# Patient Record
Sex: Male | Born: 1995 | Hispanic: Refuse to answer | Marital: Single | Smoking: Current every day smoker
Health system: Southern US, Community
[De-identification: ages and names within clinical notes are randomized; demographics above are authoritative.]

---
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
## 2019-07-25 ENCOUNTER — Encounter (HOSPITAL_COMMUNITY): Payer: Self-pay | Admitting: Psychiatry

## 2019-07-25 ENCOUNTER — Inpatient Hospital Stay (HOSPITAL_COMMUNITY)
Admission: AD | Admit: 2019-07-25 | Discharge: 2019-07-29 | DRG: 881 | Disposition: A | Discharge status: Home / Self Care | Payer: BC Managed Care – PPO | Attending: Psychiatry | Admitting: Psychiatry

## 2019-07-25 ENCOUNTER — Other Ambulatory Visit: Payer: Self-pay

## 2019-07-25 DIAGNOSIS — F419 Anxiety disorder, unspecified: Secondary | ICD-10-CM | POA: Diagnosis present

## 2019-07-25 DIAGNOSIS — F22 Delusional disorders: Secondary | ICD-10-CM | POA: Diagnosis present

## 2019-07-25 DIAGNOSIS — F17210 Nicotine dependence, cigarettes, uncomplicated: Secondary | ICD-10-CM | POA: Diagnosis present

## 2019-07-25 DIAGNOSIS — Z20822 Contact with and (suspected) exposure to covid-19: Secondary | ICD-10-CM | POA: Diagnosis present

## 2019-07-25 DIAGNOSIS — F333 Major depressive disorder, recurrent, severe with psychotic symptoms: Secondary | ICD-10-CM

## 2019-07-25 DIAGNOSIS — R45851 Suicidal ideations: Secondary | ICD-10-CM | POA: Diagnosis present

## 2019-07-25 DIAGNOSIS — F329 Major depressive disorder, single episode, unspecified: Principal | ICD-10-CM | POA: Diagnosis present

## 2019-07-25 DIAGNOSIS — F29 Unspecified psychosis not due to a substance or known physiological condition: Secondary | ICD-10-CM | POA: Diagnosis present

## 2019-07-25 DIAGNOSIS — F1290 Cannabis use, unspecified, uncomplicated: Secondary | ICD-10-CM | POA: Diagnosis present

## 2019-07-25 LAB — CBC
HCT: 46 % (ref 39.0–52.0)
Hemoglobin: 15.5 g/dL (ref 13.0–17.0)
MCH: 30.5 pg (ref 26.0–34.0)
MCHC: 33.7 g/dL (ref 30.0–36.0)
MCV: 90.6 fL (ref 80.0–100.0)
Platelets: 202 10*3/uL (ref 150–400)
RBC: 5.08 MIL/uL (ref 4.22–5.81)
RDW: 12.3 % (ref 11.5–15.5)
WBC: 7 10*3/uL (ref 4.0–10.5)
nRBC: 0 % (ref 0.0–0.2)

## 2019-07-25 LAB — COMPREHENSIVE METABOLIC PANEL
ALT: 14 U/L (ref 0–44)
AST: 15 U/L (ref 15–41)
Albumin: 4.4 g/dL (ref 3.5–5.0)
Alkaline Phosphatase: 66 U/L (ref 38–126)
Anion gap: 12 (ref 5–15)
BUN: 10 mg/dL (ref 6–20)
CO2: 26 mmol/L (ref 22–32)
Calcium: 9.3 mg/dL (ref 8.9–10.3)
Chloride: 105 mmol/L (ref 98–111)
Creatinine, Ser: 0.97 mg/dL (ref 0.61–1.24)
GFR calc Af Amer: >60 mL/min (ref >60)
GFR calc non Af Amer: >60 mL/min (ref >60)
Glucose, Bld: 92 mg/dL (ref 70–99)
Potassium: 3.8 mmol/L (ref 3.5–5.1)
Sodium: 143 mmol/L (ref 135–145)
Total Bilirubin: 0.6 mg/dL (ref 0.3–1.2)
Total Protein: 7.3 g/dL (ref 6.5–8.1)

## 2019-07-25 LAB — LIPID PANEL
Cholesterol: 183 mg/dL (ref 0–200)
HDL: 43 mg/dL (ref >40)
LDL Cholesterol: 96 mg/dL (ref 0–99)
Total CHOL/HDL Ratio: 4.3 RATIO
Triglycerides: 218 mg/dL — ABNORMAL HIGH (ref <150)
VLDL: 44 mg/dL — ABNORMAL HIGH (ref 0–40)

## 2019-07-25 LAB — TSH: TSH: 2.129 u[IU]/mL (ref 0.350–4.500)

## 2019-07-25 LAB — HEMOGLOBIN A1C
Hgb A1c MFr Bld: 5.2 % (ref 4.8–5.6)
Mean Plasma Glucose: 102.54 mg/dL

## 2019-07-25 LAB — HEPATIC FUNCTION PANEL
ALT: 14 U/L (ref 0–44)
AST: 15 U/L (ref 15–41)
Albumin: 4.5 g/dL (ref 3.5–5.0)
Alkaline Phosphatase: 65 U/L (ref 38–126)
Bilirubin, Direct: <0.1 mg/dL (ref 0.0–0.2)
Total Bilirubin: 0.5 mg/dL (ref 0.3–1.2)
Total Protein: 7.4 g/dL (ref 6.5–8.1)

## 2019-07-25 LAB — MAGNESIUM: Magnesium: 2.4 mg/dL (ref 1.7–2.4)

## 2019-07-25 LAB — ETHANOL: Alcohol, Ethyl (B): <10 mg/dL (ref <10)

## 2019-07-25 LAB — SARS CORONAVIRUS 2 BY RT PCR (HOSPITAL ORDER, PERFORMED IN ~~LOC~~ HOSPITAL LAB): SARS Coronavirus 2: NEGATIVE

## 2019-07-25 MED ORDER — HYDROXYZINE HCL 25 MG PO TABS
25.0000 mg | ORAL_TABLET | Freq: Three times a day (TID) | ORAL | Status: DC | PRN
  Administered 2019-07-25 – 2019-07-27 (×3): 25 mg via ORAL
  Filled 2019-07-25 (×3): qty 1
  Filled 2019-07-25: qty 10

## 2019-07-25 MED ORDER — THIAMINE HCL 100 MG PO TABS
100.0000 mg | ORAL_TABLET | Freq: Every day | ORAL | Status: DC
  Administered 2019-07-25 – 2019-07-29 (×5): 100 mg via ORAL
  Filled 2019-07-25 (×8): qty 1

## 2019-07-25 MED ORDER — MAGNESIUM HYDROXIDE 400 MG/5ML PO SUSP: 30.0000 mL | Freq: Every day | ORAL | Status: DC | PRN

## 2019-07-25 MED ORDER — LORAZEPAM 1 MG PO TABS: 1.0000 mg | ORAL_TABLET | Freq: Four times a day (QID) | ORAL | Status: DC | PRN

## 2019-07-25 MED ORDER — ZIPRASIDONE MESYLATE 20 MG IM SOLR: 20.0000 mg | INTRAMUSCULAR | Status: DC | PRN

## 2019-07-25 MED ORDER — LORAZEPAM 1 MG PO TABS: 1.0000 mg | ORAL_TABLET | ORAL | Status: DC | PRN

## 2019-07-25 MED ORDER — TRAZODONE HCL 50 MG PO TABS
50.0000 mg | ORAL_TABLET | Freq: Every evening | ORAL | Status: DC | PRN
  Administered 2019-07-26: 50 mg via ORAL
  Filled 2019-07-25 (×2): qty 1

## 2019-07-25 MED ORDER — ACETAMINOPHEN 325 MG PO TABS: 650.0000 mg | ORAL_TABLET | Freq: Four times a day (QID) | ORAL | Status: DC | PRN

## 2019-07-25 MED ORDER — SERTRALINE HCL 25 MG PO TABS
25.0000 mg | ORAL_TABLET | Freq: Every day | ORAL | Status: DC
  Administered 2019-07-25 – 2019-07-29 (×5): 25 mg via ORAL
  Filled 2019-07-25 (×4): qty 1
  Filled 2019-07-25: qty 7
  Filled 2019-07-25 (×3): qty 1

## 2019-07-25 MED ORDER — ALUM & MAG HYDROXIDE-SIMETH 200-200-20 MG/5ML PO SUSP: 30.0000 mL | ORAL | Status: DC | PRN

## 2019-07-25 MED ORDER — TRAZODONE HCL 50 MG PO TABS: 50.0000 mg | ORAL_TABLET | Freq: Every evening | ORAL | Status: DC | PRN

## 2019-07-25 MED ORDER — RISPERIDONE 2 MG PO TBDP: 2.0000 mg | ORAL_TABLET | Freq: Three times a day (TID) | ORAL | Status: DC | PRN

## 2019-07-25 MED ORDER — RISPERIDONE 2 MG PO TBDP
2.0000 mg | ORAL_TABLET | Freq: Every day | ORAL | Status: DC
  Administered 2019-07-25 – 2019-07-28 (×4): 2 mg via ORAL
  Filled 2019-07-25: qty 2
  Filled 2019-07-25 (×2): qty 1
  Filled 2019-07-25: qty 7
  Filled 2019-07-25 (×3): qty 1

## 2019-07-25 MED ORDER — HYDROXYZINE HCL 25 MG PO TABS: 25.0000 mg | ORAL_TABLET | Freq: Three times a day (TID) | ORAL | Status: DC | PRN

## 2019-07-25 MED ORDER — RISPERIDONE 1 MG PO TBDP
1.0000 mg | ORAL_TABLET | Freq: Every day | ORAL | Status: DC
  Administered 2019-07-25 – 2019-07-27 (×3): 1 mg via ORAL
  Filled 2019-07-25 (×5): qty 1

## 2019-07-25 MED ORDER — FOLIC ACID 1 MG PO TABS
1.0000 mg | ORAL_TABLET | Freq: Every day | ORAL | Status: DC
  Administered 2019-07-25 – 2019-07-29 (×5): 1 mg via ORAL
  Filled 2019-07-25 (×8): qty 1

## 2019-07-25 NOTE — H&P (Signed)
Psychiatric Admission Assessment Adult  Patient Identification: Riley [name] MRN:  [id number] Date of Evaluation:  07/25/2019 Chief Complaint:  MDD (major depressive disorder) [F32.9] Principal Diagnosis: MDD (major depressive disorder) Diagnosis:  Principal Problem:   MDD (major depressive disorder)  History of Present Illness: Patient presents to [facility] behavioral health voluntarily for walk-in assessment.  Patient assessed by nurse practitioner.  Patient alert and oriented, answers appropriately.  Patient tearful during assessment. Patient states "I have been depressed for years but I do not want to kill myself."  Patient endorses suicidal ideations, worsening x1 week.  Patient denies history of suicide attempts, denies self-harm behaviors. Patient denies homicidal ideations. Patient reports paranoia, states "I feel like there is somebody outside, it happens sometimes when I am alone." Patient reports auditory and visual hallucinations.  Patient states "sometimes I hear voices, people talking, sometimes I see people standing in a corner." Patient reports history of alcohol and marijuana use.  Patient reports "I have slowed down over the last few months and I still feel the same."  Patient denies substance use aside from marijuana. Patient reports he lives in [place] with his mother.  Patient denies access to weapons.  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  Patient reports he has never been treated for psychiatric concerns. Patient stressors include completion of suicide by his father when he was 71 years old and recent suicide completion of a friend.   Associated Signs/Symptoms: Depression Symptoms:  depressed mood, anhedonia, hopelessness, suicidal thoughts without plan, anxiety, loss of energy/fatigue, (Hypo) Manic Symptoms:  Hallucinations, Anxiety Symptoms:  Excessive Worry, Psychotic Symptoms:  Hallucinations: Auditory Visual Paranoia, PTSD Symptoms: NA Total  Time spent with patient: 45 minutes  Past Psychiatric History: None reported  Is the patient at risk to self? Yes.    Has the patient been a risk to self in the past 6 months? Yes.    Has the patient been a risk to self within the distant past? No.  Is the patient a risk to others? No.  Has the patient been a risk to others in the past 6 months? No.  Has the patient been a risk to others within the distant past? No.   Prior Inpatient Therapy: Prior Inpatient Therapy: No Prior Outpatient Therapy: Prior Outpatient Therapy: No Does patient have an ACCT team?: No Does patient have Intensive In-House Services?  : No Does patient have [facility] services? : No Does patient have P4CC services?: No  Alcohol Screening:   Substance Abuse History in the last 12 months:  Yes.   Consequences of Substance Abuse: NA Previous Psychotropic Medications: No  Psychological Evaluations: No  Past Medical History: No past medical history on file.  Family History: No family history on file. Family Psychiatric  History: Father- suicide completion Tobacco Screening:   Social History:  Social History   Substance and Sexual Activity  Alcohol Use Yes   Comment: Occasional 1-2 beers     Social History   Substance and Sexual Activity  Drug Use Yes  . Types: Marijuana   Comment: occasional THC Use    Additional Social History: Marital status: Single    Pain Medications: see MAR Prescriptions: see MAR Over the Counter: see MAR History of alcohol / drug use?: Yes Longest period of sobriety (when/how long): states that he has been slowing down on his use of alcohol and marijuana over the past three years Name of Substance 1: alcohol 1 - Age of First Use: UTA 1 - Amount (  size/oz): 1-2 beers 1 - Frequency: occasionally 1 - Duration: since onset 1 - Last Use / Amount: this past weekend Name of Substance 2: marijuana 2 - Age of First Use: UTA 2 - Amount (size/oz): UTA 2 - Frequency: occasional  use 2 - Duration: since onset 2 - Last Use / Amount: UTA                Allergies:  Not on File Lab Results: No results found for this or any previous visit (from the past 48 hour(s)).  Blood Alcohol level:  No results found for: [facility]  Metabolic Disorder Labs:  No results found for: HGBA1C, MPG No results found for: PROLACTIN No results found for: CHOL, TRIG, HDL, CHOLHDL, VLDL, LDLCALC  Current Medications: Current Facility-Administered Medications  Medication Dose Route Frequency Provider Last Rate Last Admin  . acetaminophen (TYLENOL) tablet 650 mg  650 mg Oral Q6H PRN Patrcia Dolly, FNP      . alum & mag hydroxide-simeth (MAALOX/MYLANTA) 200-200-20 MG/5ML suspension 30 mL  30 mL Oral Q4H PRN Patrcia Dolly, FNP      . hydrOXYzine (ATARAX/VISTARIL) tablet 25 mg  25 mg Oral TID PRN Patrcia Dolly, FNP      . magnesium hydroxide (MILK OF MAGNESIA) suspension 30 mL  30 mL Oral Daily PRN Patrcia Dolly, FNP      . traZODone (DESYREL) tablet 50 mg  50 mg Oral QHS PRN Patrcia Dolly, FNP       PTA Medications: No medications prior to admission.    Musculoskeletal: Strength & Muscle Tone: within normal limits Gait & Station: normal Patient leans: N/A  Psychiatric Specialty Exam: Physical Exam  Nursing note and vitals reviewed. Constitutional: He is oriented to person, place, and time. He appears well-developed.  HENT:  Head: Normocephalic.  Cardiovascular: Normal rate.  Respiratory: Effort normal.  Neurological: He is alert and oriented to person, place, and time.  Psychiatric: His speech is normal and behavior is normal. Judgment normal. Cognition and memory are normal. He exhibits a depressed mood. He expresses suicidal ideation.    Review of Systems  Constitutional: Negative.   HENT: Negative.   Eyes: Negative.   Respiratory: Negative.   Cardiovascular: Negative.   Gastrointestinal: Negative.   Genitourinary: Negative.   Musculoskeletal: Negative.   Skin: Negative.    Neurological: Negative.   Psychiatric/Behavioral: Positive for hallucinations and suicidal ideas.    Blood pressure 124/90, pulse 100, temperature 98.4 F (36.9 C), temperature source Oral, resp. rate 18, SpO2 100 %.There is no height or weight on file to calculate BMI.  General Appearance: Casual and Fairly Groomed  Eye Contact:  Fair  Speech:  Clear and Coherent and Normal Rate  Volume:  Decreased  Mood:  Depressed and Hopeless  Affect:  Depressed and Tearful  Thought Process:  Coherent, Goal Directed and Descriptions of Associations: Intact  Orientation:  Full (Time, Place, and Person)  Thought Content:  Hallucinations: Auditory Visual  Suicidal Thoughts:  Yes.  without intent/plan  Homicidal Thoughts:  No  Memory:  Immediate;   Fair Recent;   Fair Remote;   Fair  Judgement:  Fair  Insight:  Fair  Psychomotor Activity:  Normal  Concentration:  Concentration: Good and Attention Span: Good  Recall:  Good  Fund of Knowledge:  Good  Language:  Good  Akathisia:  No  Handed:  Right  AIMS (if indicated):     Assets:  Communication Skills Desire for Improvement Financial Resources/Insurance Housing  Intimacy Leisure Time Physical Health Resilience Social Support  ADL's:  Intact  Cognition:  WNL  Sleep:       Treatment Plan Summary: Daily contact with patient to assess and evaluate symptoms and progress in treatment  Observation Level/Precautions:  15 minute checks  Laboratory:  See results  Psychotherapy:    Medications:    Consultations:    Discharge Concerns:    Estimated LOS:  Other:     Physician Treatment Plan for Primary Diagnosis: MDD (major depressive disorder) Long Term Goal(s): Improvement in symptoms so as ready for discharge  Short Term Goals: Ability to verbalize feelings will improve, Ability to disclose and discuss suicidal ideas, Ability to demonstrate self-control will improve and Ability to identify and develop effective coping behaviors will  improve  Physician Treatment Plan for Secondary Diagnosis: Principal Problem:   MDD (major depressive disorder)  Long Term Goal(s): Improvement in symptoms so as ready for discharge  Short Term Goals: Ability to identify changes in lifestyle to reduce recurrence of condition will improve, Ability to verbalize feelings will improve, Ability to disclose and discuss suicidal ideas, Ability to demonstrate self-control will improve and Ability to identify and develop effective coping behaviors will improve  I certify that inpatient services furnished can reasonably be expected to improve the patient's condition.    Emmaline Kluver, FNP 6/3/20211:15 PM

## 2019-07-25 NOTE — [facility] Group Notes (Signed)
The focus of this group is to help patients review their daily goal of treatment. Pt didn't attend group.

## 2019-07-25 NOTE — Tx Team (Signed)
Initial Treatment Plan 07/25/2019 6:47 PM Riley [name] EBB:[id number]    PATIENT STRESSORS: Substance abuse Traumatic event ("My dad committed suicide when I was 24 years old).   PATIENT STRENGTHS: Ability for insight Capable of independent living Licensed conveyancer Physical Health Supportive family/friends Work skills   PATIENT IDENTIFIED PROBLEMS: Risk for suicide "I don't want to go through this no more, I'm tire of doing this, I had a gun to my head before but I could not do it".    Alterations in mood (Anxiety & Depression) "I just sad, tired of it all".    Alteration in sleep pattern "I only sleep about 2-4 hours, like for months now".    Ineffective Coping Skill (Substance Use) "I smoke weed like everyday but the last time I smoked was like 2 days ago".         DISCHARGE CRITERIA:  Improved stabilization in mood, thinking, and/or behavior Verbal commitment to aftercare and medication compliance  PRELIMINARY DISCHARGE PLAN: Outpatient therapy Participate in family therapy Return to previous living arrangement Return to previous work or school arrangements  PATIENT/FAMILY INVOLVEMENT: This treatment plan has been presented to and reviewed with the patient, Riley [name].  The patient have been given the opportunity to ask questions and make suggestions.  Sherryl Manges, RN 07/25/2019, 6:47 PM

## 2019-07-25 NOTE — Progress Notes (Signed)
Pt is a 24 y/o caucasian male who presents to [facility] as a walk in accompanied by his mother. Pt observed to be very depressed, anxious and tearful on interactions. Rates his depression 8/10 and anxiety 10/10. Reports worsening depression and anxiety with an onset of auditory hallucinations for the last two months, "I hear people outside of my house talking about me derogatory terms "you are not doing nothing, you just play video games, I'm tired living like this, I can't do this no more". Per pt he's been sleeping poorly "I get about 2-4 hours of sleep at night with no appetite at all lately". States he has attempted suicide with a gun to his head in the past "but I just couldn't do it".Per patient's mother, his father committed suicide 23 years ago when patient was only 24 years old. His paternal uncle committed suicide 5 years prior to patient's father. Mother also reported "heavy drinking on the father's side of the family". 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 He admits to smoking THC daily but last used on 07/24/19. Drinks 2-3 beers occasionally, last drink was "some days ago". 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 This is his first inpatient treatment, he has never seek help for his depression before or taken any medications in the past. He graduated from [employer] and lives with his mother and older brother. Denies HI, VH and physical pain at time of assessment.  Emotional support and encouragement provided to pt as needed throughout this shift. Skin assessment completed and pt's skin is intact, two tattoos noted on left arm. All belongings searched and secure in locker 55. Pt tolerated all PO intake fairly when offered. Unit orientation done, routines discusses and care plan reviewed  with pt; understanding verbalized. Q 15 minutes safety checks initiated without self harm gestures.

## 2019-07-25 NOTE — BH Assessment (Signed)
Assessment Note  Riley [name] is an 24 y.o. male who presented to [facility] as a walk-in with his mother seeking help for his depression.  Patient states that he has been depressed for years and he states that he is tired of feeling this way.  Patient states that he had a breakdown the other day and he states that he was hyper-venelating and he was unable to regain control.  Patient states that he is tired of feeling depressed and states that he is having suicidal thoughts, but he did not identify any plan.  Patient states, "I don't really want to kill myself, but I cannot keep living this way.  Patient states that his father committed suicide when he was [age] years old.  Patient denies HI, but states that he has been hearing voices of people talking in his head and he has been feeling paranoid like people are outside his house. Patient states that he was staying at his family's farm when he started hearing the voices and his mother had to come and get him.  Patient states that he had a history of drinking and smoking marijuana and states that he used to do both on a regular basis, but states that he started cutting back three years ago and he states that he only uses on occasion these days.  He states, "I don't use like that."  Patient states that he has not been sleeping and he has not been eating well and states that he has lost at least fifteen pounds recently.  Patient denies a history of self-mutilation and abuse.  Patient states that he recently had to quit his job because he could not manage his stress and it hindered his ability to perform.  Patient presented as oriented and alert.  His mood depressed and his affect was flat/blunted, but he was very tearful at times.  His judgment, insight and impulse control are impaired.  His thoughts were organized and his memory intact.  He die not appear to be responding to any internal stimuli during his assessment.  Hie eye contact was good, but his speech was mildly  pressured.  Diagnosis: F32.2 MDD Single Episode Severe  Past Medical History: No past medical history on file.   Family History: No family history on file.  Social History:  reports current alcohol use. He reports current drug use. Drug: Marijuana. No history on file for tobacco.  Additional Social History:  Alcohol / Drug Use Pain Medications: see MAR Prescriptions: see MAR Over the Counter: see MAR History of alcohol / drug use?: Yes Longest period of sobriety (when/how long): states that he has been slowing down on his use of alcohol and marijuana over the past three years Substance #1 Name of Substance 1: alcohol 1 - Age of First Use: UTA 1 - Amount (size/oz): 1-2 beers 1 - Frequency: occasionally 1 - Duration: since onset 1 - Last Use / Amount: this past weekend Substance #2 Name of Substance 2: marijuana 2 - Age of First Use: UTA 2 - Amount (size/oz): UTA 2 - Frequency: occasional use 2 - Duration: since onset 2 - Last Use / Amount: UTA  CIWA: CIWA-Ar BP: 124/90 Pulse Rate: 100 COWS:    Allergies: Not on File  Home Medications:  No medications prior to admission.    OB/GYN Status:  No LMP for male patient.  General Assessment Data Location of Assessment: [facility] Assessment Services TTS Assessment: In system Is this a Tele or Face-to-Face Assessment?: Face-to-Face Is  this an Initial Assessment or a Re-assessment for this encounter?: Initial Assessment Patient Accompanied by:: Parent Language Other than English: No Living Arrangements: Other (Comment)(has his own place) What gender do you identify as?: Male Date Telepsych consult ordered in [facility]: (NA) Time Telepsych consult ordered in [facility]: (N/A) Marital status: Single Living Arrangements: Parent Can pt return to current living arrangement?: Yes Admission Status: Voluntary Is patient capable of signing voluntary admission?: Yes Referral Source: Self/Family/Friend Insurance type: BCBS  Medical Screening  Exam [facility] Walk-in ONLY) Medical Exam completed: Yes  Crisis Care Plan Living Arrangements: Parent Legal Guardian: Other:(self) Name of Psychiatrist: none Name of Therapist: none  Education Status Is patient currently in school?: No Is the patient employed, unemployed or receiving disability?: Unemployed  Risk to self with the past 6 months Suicidal Ideation: Yes-Currently Present Has patient been a risk to self within the past 6 months prior to admission? : Yes Suicidal Intent: No Has patient had any suicidal intent within the past 6 months prior to admission? : No Is patient at risk for suicide?: Yes Suicidal Plan?: No Has patient had any suicidal plan within the past 6 months prior to admission? : No Access to Means: No What has been your use of drugs/alcohol within the last 12 months?: (occasional alcohol/THC) Previous Attempts/Gestures: No How many times?: 0 Other Self Harm Risks: unemployed Triggers for Past Attempts: None known Intentional Self Injurious Behavior: None Family Suicide History: Yes Recent stressful life event(s): Loss (Comment), Job Loss Persecutory voices/beliefs?: No Depression: Yes Depression Symptoms: Despondent, Insomnia, Isolating, Loss of interest in usual pleasures, Feeling worthless/self pity Substance abuse history and/or treatment for substance abuse?: Yes Suicide prevention information given to non-admitted patients: Not applicable  Risk to Others within the past 6 months Homicidal Ideation: No Does patient have any lifetime risk of violence toward others beyond the six months prior to admission? : No Thoughts of Harm to Others: No Current Homicidal Intent: No Current Homicidal Plan: No Access to Homicidal Means: No Identified Victim: none History of harm to others?: No Assessment of Violence: None Noted Violent Behavior Description: none Does patient have access to weapons?: No Criminal Charges Pending?: No Does patient have a court  date: No Is patient on probation?: No  Psychosis Hallucinations: Auditory Delusions: None noted  Mental Status Report Appearance/Hygiene: Unremarkable Eye Contact: Good Motor Activity: Freedom of movement Speech: Logical/coherent Level of Consciousness: Alert Mood: Depressed, Anxious Affect: Flat Anxiety Level: Moderate Thought Processes: Coherent, Relevant Judgement: Impaired Orientation: Person, Place, Time, Situation Obsessive Compulsive Thoughts/Behaviors: None  Cognitive Functioning Concentration: Decreased Memory: Recent Intact, Remote Intact Is patient IDD: No Insight: Poor Impulse Control: Poor Appetite: Poor Have you had any weight changes? : Loss Amount of the weight change? (lbs): 15 lbs Sleep: Decreased Total Hours of Sleep: (not sleeping) Vegetative Symptoms: None  ADLScreening [facility] Assessment Services) Patient's cognitive ability adequate to safely complete daily activities?: Yes Patient able to express need for assistance with ADLs?: Yes Independently performs ADLs?: Yes (appropriate for developmental age)  Prior Inpatient Therapy Prior Inpatient Therapy: No  Prior Outpatient Therapy Prior Outpatient Therapy: No Does patient have an ACCT team?: No Does patient have Intensive In-House Services?  : No Does patient have [facility] services? : No Does patient have P4CC services?: No  ADL Screening (condition at time of admission) Patient's cognitive ability adequate to safely complete daily activities?: Yes Is the patient deaf or have difficulty hearing?: No Does the patient have difficulty seeing, even when wearing glasses/contacts?: No Does  the patient have difficulty concentrating, remembering, or making decisions?: No Patient able to express need for assistance with ADLs?: Yes Does the patient have difficulty dressing or bathing?: No Independently performs ADLs?: Yes (appropriate for developmental age) Does the patient have difficulty walking or  climbing stairs?: No Weakness of Legs: None Weakness of Arms/Hands: None  Home Assistive Devices/Equipment Home Assistive Devices/Equipment: None  Therapy Consults (therapy consults require a physician order) PT Evaluation Needed: No OT Evalulation Needed: No SLP Evaluation Needed: No Abuse/Neglect Assessment (Assessment to be complete while patient is alone) Abuse/Neglect Assessment Can Be Completed: Yes Physical Abuse: Denies Verbal Abuse: Denies Sexual Abuse: Denies Exploitation of patient/patient's resources: Denies Self-Neglect: Denies Values / Beliefs Cultural Requests During Hospitalization: None Spiritual Requests During Hospitalization: None Consults Spiritual Care Consult Needed: No Transition of Care Team Consult Needed: No Advance Directives (For Healthcare) Does Patient Have a Medical Advance Directive?: No Would patient like information on creating a medical advance directive?: No - Patient declined Nutrition Screen- [facility] Adult/[facility]/AP Has the patient recently lost weight without trying?: Yes, 14-23 lbs. Has the patient been eating poorly because of a decreased appetite?: Yes Malnutrition Screening Tool Score: 3        Disposition: Per Berneice Heinrich, NP, Inpatient Treatment is recommended Disposition Initial Assessment Completed for this Encounter: Yes Disposition of Patient: Admit Type of inpatient treatment program: Adult  On Site Evaluation by:   Reviewed with Physician:    Arnoldo Lenis Addylynn Balin 07/25/2019 12:38 PM

## 2019-07-25 NOTE — [facility] Suicide Risk Assessment (Signed)
[facility] Admission Suicide Risk Assessment   Nursing information obtained from:    Demographic factors:    Current Mental Status:    Loss Factors:    Historical Factors:    Risk Reduction Factors:     Total Time spent with patient: 30 minutes Principal Problem: MDD (major depressive disorder) Diagnosis:  Principal Problem:   MDD (major depressive disorder)  Subjective Data: Patient is seen and examined.  Patient is a 24 year old male with a self-reported history of depression, marijuana use and recent onset auditory hallucinations who presented to the behavioral health hospital as a walk-in evaluation with his mother.  The patient stated that he had been suffering depression for the last 5 years.  He stated over the last several months things have gotten much worse.  He stated he began to hear auditory hallucinations.  He stated that he had used marijuana extensively for several years very heavily, and had recently used "delta 8" but that hallucinations were present prior to the delta 8.  He stated that he quit a job because of stressful issues and went from [place] to live [place] a farm that the family owns.  He went there to collect himself.  Things did not get any better there.  He stated that in the past he had held a gun to his head and was going to kill himself.  He stated "I just could not do it".  He denied any previous psychiatric admissions.  He did have a previous arrest for possession of marijuana.  He has no court dates.  He denied any previous hospitalizations secondary to substance issues.  During the interview he was quite tearful and sad.  Besides what is noted above other pertinent factors include the fact that his father committed suicide when the patient was age 52.  The father had a history of psychiatric issues as well as substance abuse issues.  The patient denied any other trauma involving physical, emotional or sexual trauma outside the death of his father.  Because of depression  and psychotic symptoms it was decided he should be admitted to the hospital for evaluation and stabilization.  Continued Clinical Symptoms:    The "Alcohol Use Disorders Identification Test", Guidelines for Use in Primary Care, Second Edition.  World Pharmacologist [facility]). Score between 0-7:  no or low risk or alcohol related problems. Score between 8-15:  moderate risk of alcohol related problems. Score between 16-19:  high risk of alcohol related problems. Score 20 or above:  warrants further diagnostic evaluation for alcohol dependence and treatment.   CLINICAL FACTORS:   Depression:   Anhedonia Delusional Hopelessness Impulsivity Insomnia Alcohol/Substance Abuse/Dependencies Currently Psychotic   Musculoskeletal: Strength & Muscle Tone: within normal limits Gait & Station: normal Patient leans: N/A  Psychiatric Specialty Exam: Physical Exam  Nursing note and vitals reviewed. Constitutional: He is oriented to person, place, and time. He appears well-developed and well-nourished.  HENT:  Head: Normocephalic and atraumatic.  Respiratory: Effort normal.  Neurological: He is alert and oriented to person, place, and time.    Review of Systems  Blood pressure 124/90, pulse 100, temperature 98.4 F (36.9 C), temperature source Oral, resp. rate 18, SpO2 100 %.There is no height or weight on file to calculate BMI.  General Appearance: Disheveled  Eye Contact:  Minimal  Speech:  Normal Rate  Volume:  Decreased  Mood:  Anxious, Depressed and Dysphoric  Affect:  Congruent  Thought Process:  Coherent and Descriptions of Associations: Loose  Orientation:  Full (Time, Place, and Person)  Thought Content:  Delusions, Hallucinations: Auditory, Paranoid Ideation and Rumination  Suicidal Thoughts:  Yes.  with intent/plan  Homicidal Thoughts:  No  Memory:  Immediate;   Fair Recent;   Fair Remote;   Fair  Judgement:  Impaired  Insight:  Fair  Psychomotor Activity:  Increased   Concentration:  Concentration: Fair and Attention Span: Fair  Recall:  [employer] of Knowledge:  Fair  Language:  Fair  Akathisia:  Negative  Handed:  Right  AIMS (if indicated):     Assets:  Desire for Improvement Resilience Social Support  ADL's:  Intact  Cognition:  WNL  Sleep:         COGNITIVE FEATURES THAT CONTRIBUTE TO RISK:  None    SUICIDE RISK:   Severe:  Frequent, intense, and enduring suicidal ideation, specific plan, no subjective intent, but some objective markers of intent (i.e., choice of lethal method), the method is accessible, some limited preparatory behavior, evidence of impaired self-control, severe dysphoria/symptomatology, multiple risk factors present, and few if any protective factors, particularly a lack of social support.  PLAN OF CARE: Patient is seen and examined.  Patient is a 24 year old male with the above-stated past psychiatric history who is being admitted secondary to worsening depression, suicidal ideation as well as psychotic symptoms.  He will be admitted to the hospital.  He will be integrated in the milieu.  He will be encouraged to attend groups.  He will be placed on Zoloft 25 mg p.o. daily, this to be titrated during the course of hospitalization.  He will also be placed on Risperdal 1 mg p.o. daily and 2 mg p.o. nightly for psychotic symptoms as well as augmentation of the Zoloft.  Because of concern for possible alcohol withdrawal symptoms and delirium being the source of augmentation of his symptoms he will have available lorazepam 1 mg p.o. every 6 hours as needed a CIWA greater than 10.  He will also be placed on folic acid as well as thiamine preemptively until we see what his laboratories are doing.  I have gone on and moved his laboratories to be done this afternoon.  This includes a stat drug screen in case there are any other substances involved.  His laboratories will include a TSH for possible contribution to his psychiatric situation.   As soon as his Covid is back to negative we will make plans to have him assigned to a bed.  I certify that inpatient services furnished can reasonably be expected to improve the patient's condition.   Antonieta Pert, MD 07/25/2019, 1:47 PM

## 2019-07-26 LAB — PROLACTIN: Prolactin: 68 ng/mL — ABNORMAL HIGH (ref 4.0–15.2)

## 2019-07-26 MED ORDER — ADULT MULTIVITAMIN W/MINERALS CH
1.0000 | ORAL_TABLET | Freq: Every day | ORAL | Status: DC
  Administered 2019-07-26 – 2019-07-29 (×4): 1 via ORAL
  Filled 2019-07-26 (×6): qty 1

## 2019-07-26 NOTE — Progress Notes (Signed)
   07/26/19 0618  Vital Signs  Temp (!) 97.5 F (36.4 C)  Temp Source Oral  Pulse Rate 69  BP (!) 140/102  BP Location Right Arm  BP Method Automatic  Patient Position (if appropriate) Standing   D: Patient Presents with depressed mood and affect.  Patient was calm and cooperative during med pass and took his medicine without incident.  Patient was isolaotive in room.  Patient denies suicidal thoughts and self harming thoughts. Patient rated depression and anxiety 5/10. A:  Patient took scheduled medicine.  Support and encouragement provided Routine safety checks conducted every 15 minutes. Patient  Informed to notify staff with any concerns.   R:Safety maintained.

## 2019-07-26 NOTE — Progress Notes (Signed)
   07/25/19 2200  Psych Admission Type (Psych Patients Only)  Admission Status Voluntary  Psychosocial Assessment  Patient Complaints Depression;Hopelessness  Eye Contact Brief  Facial Expression Flat  Affect Appropriate to circumstance  Speech Logical/coherent  Interaction Cautious  Motor Activity Slow  Appearance/Hygiene Unremarkable  Behavior Characteristics Cooperative;Appropriate to situation  Mood Depressed;Anxious  Thought Process  Coherency WDL  Content WDL  Delusions None reported or observed  Perception WDL  Hallucination None reported or observed  Judgment Impaired  Confusion None  Danger to Self  Current suicidal ideation? Denies  Danger to Others  Danger to Others None reported or observed  Pt. Somewhat isolative to his room tonight, he did come out intermittently for brief periods of time.  Pt. Presents as flat but appropriate and cooperative.  Pt. 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  He states that there was no overt stress occurring prior to the onset of the AH, states that he was not sleeping well but since returning home to [place] has been sleeping better and notes that the [facility] have decreased.  Pt. Reports that he has not noticed any AH today and denies SI/HI/VH.  Pt. Provided with support and encouragement, scheduled medication administered without difficulty and patient remains safe on the unit.

## 2019-07-26 NOTE — Tx Team (Signed)
Interdisciplinary Treatment and Diagnostic Plan Update  07/26/2019 Time of Session: 9:00am Riley [name] MRN: [id number]  Principal Diagnosis: MDD (major depressive disorder)  Secondary Diagnoses: Principal Problem:   MDD (major depressive disorder) Active Problems:   Psychosis ([place])   Current Medications:  Current Facility-Administered Medications  Medication Dose Route Frequency Provider Last Rate Last Admin  . acetaminophen (TYLENOL) tablet 650 mg  650 mg Oral Q6H PRN Sharma Covert, MD      . alum & mag hydroxide-simeth (MAALOX/MYLANTA) 200-200-20 MG/5ML suspension 30 mL  30 mL Oral Q4H PRN Emmaline Kluver, FNP      . folic acid (FOLVITE) tablet 1 mg  1 mg Oral Daily Sharma Covert, MD   1 mg at 07/26/19 0736  . hydrOXYzine (ATARAX/VISTARIL) tablet 25 mg  25 mg Oral TID PRN Emmaline Kluver, FNP   25 mg at 07/25/19 1503  . LORazepam (ATIVAN) tablet 1 mg  1 mg Oral Q6H PRN Sharma Covert, MD      . risperiDONE (RISPERDAL M-TABS) disintegrating tablet 2 mg  2 mg Oral Q8H PRN Sharma Covert, MD       And  . LORazepam (ATIVAN) tablet 1 mg  1 mg Oral PRN Sharma Covert, MD       And  . ziprasidone (GEODON) injection 20 mg  20 mg Intramuscular PRN Sharma Covert, MD      . magnesium hydroxide (MILK OF MAGNESIA) suspension 30 mL  30 mL Oral Daily PRN Emmaline Kluver, FNP      . risperiDONE (RISPERDAL M-TABS) disintegrating tablet 1 mg  1 mg Oral Daily Sharma Covert, MD   1 mg at 07/26/19 0736  . risperiDONE (RISPERDAL M-TABS) disintegrating tablet 2 mg  2 mg Oral QHS Sharma Covert, MD   2 mg at 07/25/19 2051  . sertraline (ZOLOFT) tablet 25 mg  25 mg Oral Daily Sharma Covert, MD   25 mg at 07/26/19 0736  . thiamine tablet 100 mg  100 mg Oral Daily Sharma Covert, MD   100 mg at 07/26/19 0736  . traZODone (DESYREL) tablet 50 mg  50 mg Oral QHS PRN Sharma Covert, MD       PTA Medications: No medications prior to admission.    Patient Stressors:  Substance abuse Traumatic event  Patient Strengths: Ability for insight Capable of independent living Child psychotherapist Physical Health Supportive family/friends Work skills  Treatment Modalities: Medication Management, Group therapy, Case management,  1 to 1 session with clinician, Psychoeducation, Recreational therapy.   Physician Treatment Plan for Primary Diagnosis: MDD (major depressive disorder) Long Term Goal(s): Improvement in symptoms so as ready for discharge Improvement in symptoms so as ready for discharge   Short Term Goals: Ability to verbalize feelings will improve Ability to disclose and discuss suicidal ideas Ability to demonstrate self-control will improve Ability to identify and develop effective coping behaviors will improve Ability to identify changes in lifestyle to reduce recurrence of condition will improve Ability to verbalize feelings will improve Ability to disclose and discuss suicidal ideas Ability to demonstrate self-control will improve Ability to identify and develop effective coping behaviors will improve  Medication Management: Evaluate patient's response, side effects, and tolerance of medication regimen.  Therapeutic Interventions: 1 to 1 sessions, Unit Group sessions and Medication administration.  Evaluation of Outcomes: Not Met  Physician Treatment Plan for Secondary Diagnosis: Principal Problem:   MDD (major depressive disorder) Active Problems:   Psychosis (  [place])  Long Term Goal(s): Improvement in symptoms so as ready for discharge Improvement in symptoms so as ready for discharge   Short Term Goals: Ability to verbalize feelings will improve Ability to disclose and discuss suicidal ideas Ability to demonstrate self-control will improve Ability to identify and develop effective coping behaviors will improve Ability to identify changes in lifestyle to reduce recurrence of condition will improve Ability to  verbalize feelings will improve Ability to disclose and discuss suicidal ideas Ability to demonstrate self-control will improve Ability to identify and develop effective coping behaviors will improve     Medication Management: Evaluate patient's response, side effects, and tolerance of medication regimen.  Therapeutic Interventions: 1 to 1 sessions, Unit Group sessions and Medication administration.  Evaluation of Outcomes: Not Met   RN Treatment Plan for Primary Diagnosis: MDD (major depressive disorder) Long Term Goal(s): Knowledge of disease and therapeutic regimen to maintain health will improve  Short Term Goals: Ability to verbalize feelings will improve, Ability to identify and develop effective coping behaviors will improve and Compliance with prescribed medications will improve  Medication Management: RN will administer medications as ordered by provider, will assess and evaluate patient's response and provide education to patient for prescribed medication. RN will report any adverse and/or side effects to prescribing provider.  Therapeutic Interventions: 1 on 1 counseling sessions, Psychoeducation, Medication administration, Evaluate responses to treatment, Monitor vital signs and CBGs as ordered, Perform/monitor CIWA, COWS, AIMS and Fall Risk screenings as ordered, Perform wound care treatments as ordered.  Evaluation of Outcomes: Not Met   LCSW Treatment Plan for Primary Diagnosis: MDD (major depressive disorder) Long Term Goal(s): Safe transition to appropriate next level of care at discharge, Engage patient in therapeutic group addressing interpersonal concerns.  Short Term Goals: Engage patient in aftercare planning with referrals and resources, Increase social support, Increase emotional regulation, Identify triggers associated with mental health/substance abuse issues and Increase skills for wellness and recovery  Therapeutic Interventions: Assess for all discharge  needs, 1 to 1 time with Social worker, Explore available resources and support systems, Assess for adequacy in community support network, Educate family and significant other(s) on suicide prevention, Complete Psychosocial Assessment, Interpersonal group therapy.  Evaluation of Outcomes: Not Met   Progress in Treatment: Attending groups: No. New to unit. Participating in groups: No. Taking medication as prescribed: Yes. Toleration medication: Yes. Family/Significant other contact made: No, will contact:  supports if consents are granted. Patient understands diagnosis: Yes. Discussing patient identified problems/goals with staff: Yes. Medical problems stabilized or resolved: Yes. Denies suicidal/homicidal ideation: No. Endorses SI Issues/concerns per patient self-inventory: Yes.  New problem(s) identified: Yes, Describe:  limited social supports.  New Short Term/Long Term Goal(s): medication management for mood stabilization; elimination of SI thoughts; development of comprehensive mental wellness/sobriety plan.  Patient Goals: "Work on being more social and express myself."  Discharge Plan or Barriers: Patient recently admitted to unit, CSW assessing for appropriate referrals.  Reason for Continuation of Hospitalization: Anxiety Depression Hallucinations Medication stabilization Suicidal ideation  Estimated Length of Stay: 5-7 days  Attendees: Patient: Riley [name] 07/26/2019 9:10 AM  Physician: Queen Blossom 07/26/2019 9:10 AM  Nursing: Legrand Como, RN 07/26/2019 9:10 AM  RN Care Manager: 07/26/2019 9:10 AM  Social Worker: Stephanie Acre, [place] 07/26/2019 9:10 AM  Recreational Therapist:  07/26/2019 9:10 AM  Other:  07/26/2019 9:10 AM  Other:  07/26/2019 9:10 AM  Other: 07/26/2019 9:10 AM    Scribe for Treatment Team: Joellen Jersey, [place] 07/26/2019 9:10 AM
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
## 2019-07-26 NOTE — Progress Notes (Signed)
[facility] MD Progress Note  07/26/2019 11:39 AM Riley [name]  MRN:  [id number] Subjective: Patient is a 24 year old male who was admitted on 07/25/2019 with psychosis, depression and suicidal ideation.  Objective: Patient is seen and examined.  Patient is a 24 year old male with the above-stated past psychiatric history who is seen in follow-up.  He stated he is doing better today.  He stated that the auditory hallucinations have either decreased significantly or he is not hearing anything at all.  He stated he slept better last night.  He stated his suicidal ideation had decreased.  He felt like his mood had improved as well.  He denied any side effects to his current medications.  His blood pressure has been mildly elevated today.  Initially it was 136/82, repeat blood pressure was 140/102.  Pulse was 69.  He is afebrile.  He slept 6 hours last night.  Review of his admission laboratories revealed essentially normal electrolytes including liver function enzymes.  Lipid panel was normal.  His CBC was normal.  His prolactin was elevated at 68.  Hemoglobin A1c was 5.2.  TSH was 2.129.  Blood alcohol was less than 10.  We do not have a drug screen on him as of yet.  Principal Problem: MDD (major depressive disorder) Diagnosis: Principal Problem:   MDD (major depressive disorder) Active Problems:   Psychosis (HCC)  Total Time spent with patient: 20 minutes  Past Psychiatric History: See admission H&P  Past Medical History: History reviewed. No pertinent past medical history. History reviewed. No pertinent surgical history. Family History: History reviewed. No pertinent family history. Family Psychiatric  History: See admission H&P Social History:  Social History   Substance and Sexual Activity  Alcohol Use Yes   Comment: Occasional 1-2 beers     Social History   Substance and Sexual Activity  Drug Use Yes  . Types: Marijuana   Comment: occasional THC Use    Social History   Socioeconomic  History  . Marital status: Single    Spouse name: Not on file  . Number of children: Not on file  . Years of education: Not on file  . Highest education level: Not on file  Occupational History  . Not on file  Tobacco Use  . Smoking status: Current Every Day Smoker    Packs/day: 0.50    Types: Cigarettes  . Smokeless tobacco: Never Used  Substance and Sexual Activity  . Alcohol use: Yes    Comment: Occasional 1-2 beers  . Drug use: Yes    Types: Marijuana    Comment: occasional THC Use  . Sexual activity: Not on file  Other Topics Concern  . Not on file  Social History Narrative  . Not on file   Social Determinants of Health   Financial Resource Strain:   . Difficulty of Paying Living Expenses:   Food Insecurity:   . Worried About Programme researcher, broadcasting/film/video in the Last Year:   . Barista in the Last Year:   Transportation Needs:   . Freight forwarder (Medical):   Marland Kitchen Lack of Transportation (Non-Medical):   Physical Activity:   . Days of Exercise per Week:   . Minutes of Exercise per Session:   Stress:   . Feeling of Stress :   Social Connections:   . Frequency of Communication with Friends and Family:   . Frequency of Social Gatherings with Friends and Family:   . Attends Religious Services:   . Active Member  of Clubs or Organizations:   . Attends Banker Meetings:   Marland Kitchen Marital Status:    Additional Social History:    Pain Medications: see MAR Prescriptions: see MAR Over the Counter: see MAR History of alcohol / drug use?: Yes Longest period of sobriety (when/how long): states that he has been slowing down on his use of alcohol and marijuana over the past three years Name of Substance 1: alcohol 1 - Age of First Use: UTA 1 - Amount (size/oz): 1-2 beers 1 - Frequency: occasionally 1 - Duration: since onset 1 - Last Use / Amount: this past weekend Name of Substance 2: marijuana 2 - Age of First Use: UTA 2 - Amount (size/oz): UTA 2 -  Frequency: occasional use 2 - Duration: since onset 2 - Last Use / Amount: UTA                Sleep: Fair  Appetite:  Fair  Current Medications: Current Facility-Administered Medications  Medication Dose Route Frequency Provider Last Rate Last Admin  . acetaminophen (TYLENOL) tablet 650 mg  650 mg Oral Q6H PRN Antonieta Pert, MD      . alum & mag hydroxide-simeth (MAALOX/MYLANTA) 200-200-20 MG/5ML suspension 30 mL  30 mL Oral Q4H PRN Patrcia Dolly, FNP      . folic acid (FOLVITE) tablet 1 mg  1 mg Oral Daily Antonieta Pert, MD   1 mg at 07/26/19 0736  . hydrOXYzine (ATARAX/VISTARIL) tablet 25 mg  25 mg Oral TID PRN Patrcia Dolly, FNP   25 mg at 07/25/19 1503  . LORazepam (ATIVAN) tablet 1 mg  1 mg Oral Q6H PRN Antonieta Pert, MD      . risperiDONE (RISPERDAL M-TABS) disintegrating tablet 2 mg  2 mg Oral Q8H PRN Antonieta Pert, MD       And  . LORazepam (ATIVAN) tablet 1 mg  1 mg Oral PRN Antonieta Pert, MD       And  . ziprasidone (GEODON) injection 20 mg  20 mg Intramuscular PRN Antonieta Pert, MD      . magnesium hydroxide (MILK OF MAGNESIA) suspension 30 mL  30 mL Oral Daily PRN Patrcia Dolly, FNP      . multivitamin with minerals tablet 1 tablet  1 tablet Oral Daily Antonieta Pert, MD      . risperiDONE (RISPERDAL M-TABS) disintegrating tablet 1 mg  1 mg Oral Daily Antonieta Pert, MD   1 mg at 07/26/19 0736  . risperiDONE (RISPERDAL M-TABS) disintegrating tablet 2 mg  2 mg Oral QHS Antonieta Pert, MD   2 mg at 07/25/19 2051  . sertraline (ZOLOFT) tablet 25 mg  25 mg Oral Daily Antonieta Pert, MD   25 mg at 07/26/19 0736  . thiamine tablet 100 mg  100 mg Oral Daily Antonieta Pert, MD   100 mg at 07/26/19 0736  . traZODone (DESYREL) tablet 50 mg  50 mg Oral QHS PRN Antonieta Pert, MD        Lab Results:  Results for orders placed or performed during the hospital encounter of 07/25/19 (from the past 48 hour(s))  SARS Coronavirus 2  by RT PCR (hospital order, performed in [facility] hospital lab) Nasopharyngeal Nasopharyngeal Swab     Status: None   Collection Time: 07/25/19  1:16 PM   Specimen: Nasopharyngeal Swab  Result Value Ref Range   SARS Coronavirus 2 NEGATIVE NEGATIVE  Comment: (NOTE) SARS-CoV-2 target nucleic acids are NOT DETECTED. The SARS-CoV-2 RNA is generally detectable in upper and lower respiratory specimens during the acute phase of infection. The lowest concentration of SARS-CoV-2 viral copies this assay can detect is 250 copies / mL. A negative result does not preclude SARS-CoV-2 infection and should not be used as the sole basis for treatment or other patient management decisions.  A negative result may occur with improper specimen collection / handling, submission of specimen other than nasopharyngeal swab, presence of viral mutation(s) within the areas targeted by this assay, and inadequate number of viral copies (<250 copies / mL). A negative result must be combined with clinical observations, patient history, and epidemiological information. Fact Sheet for Patients:   BoilerBrush.com.cy Fact Sheet for Healthcare Providers: https://pope.com/ This test is not yet approved or cleared  by the [place] FDA and has been authorized for detection and/or diagnosis of SARS-CoV-2 by FDA under an Emergency Use Authorization (EUA).  This EUA will remain in effect (meaning this test can be used) for the duration of the COVID-19 declaration under Section 564(b)(1) of the Act, 21 U.S.C. section 360bbb-3(b)(1), unless the authorization is terminated or revoked sooner. Performed at [facility], [address] [place], [place] [address]   CBC     Status: None   Collection Time: 07/25/19  6:03 PM  Result Value Ref Range   WBC 7.0 4.0 - 10.5 K/uL   RBC 5.08 4.22 - 5.81 MIL/uL   Hemoglobin 15.5 13.0 - 17.0 g/dL   HCT 60.4 54.0 -  98.1 %   MCV 90.6 80.0 - 100.0 fL   MCH 30.5 26.0 - 34.0 pg   MCHC 33.7 30.0 - 36.0 g/dL   RDW 19.1 47.8 - 29.5 %   Platelets 202 150 - 400 K/uL   nRBC 0.0 0.0 - 0.2 %    Comment: Performed at [facility], [address], [place], [place] [address]  Comprehensive metabolic panel     Status: None   Collection Time: 07/25/19  6:03 PM  Result Value Ref Range   Sodium 143 135 - 145 mmol/L   Potassium 3.8 3.5 - 5.1 mmol/L   Chloride 105 98 - 111 mmol/L   CO2 26 22 - 32 mmol/L   Glucose, Bld 92 70 - 99 mg/dL    Comment: Glucose reference range applies only to samples taken after fasting for at least 8 hours.   BUN 10 6 - 20 mg/dL   Creatinine, Ser 8.65 0.61 - 1.24 mg/dL   Calcium 9.3 8.9 - 78.4 mg/dL   Total Protein 7.3 6.5 - 8.1 g/dL   Albumin 4.4 3.5 - 5.0 g/dL   AST 15 15 - 41 U/L   ALT 14 0 - 44 U/L   Alkaline Phosphatase 66 38 - 126 U/L   Total Bilirubin 0.6 0.3 - 1.2 mg/dL   GFR calc non Af Amer >60 >60 mL/min   GFR calc Af Amer >60 >60 mL/min   Anion gap 12 5 - 15    Comment: Performed at [facility], [address], [place], [place] [address]  Ethanol     Status: None   Collection Time: 07/25/19  6:03 PM  Result Value Ref Range   Alcohol, Ethyl (B) <10 <10 mg/dL    Comment: (NOTE) Lowest detectable limit for serum alcohol is 10 mg/dL. For medical purposes only. Performed at [facility], [address], [place], [place] [address]  Hemoglobin A1c     Status: None   Collection Time: 07/25/19  6:03 PM  Result Value Ref Range   Hgb A1c MFr Bld 5.2 4.8 - 5.6 %    Comment: (NOTE) Pre diabetes:          5.7%-6.4% Diabetes:              >6.4% Glycemic control for   <7.0% adults with diabetes    Mean Plasma Glucose 102.54 mg/dL    Comment: Performed at [facility], [address] [place], [place] [address]  Hepatic function panel     Status: None   Collection Time: 07/25/19  6:03 PM  Result Value Ref  Range   Total Protein 7.4 6.5 - 8.1 g/dL   Albumin 4.5 3.5 - 5.0 g/dL   AST 15 15 - 41 U/L   ALT 14 0 - 44 U/L   Alkaline Phosphatase 65 38 - 126 U/L   Total Bilirubin 0.5 0.3 - 1.2 mg/dL   Bilirubin, Direct <6.0<0.1 0.0 - 0.2 mg/dL   Indirect Bilirubin NOT CALCULATED 0.3 - 0.9 mg/dL    Comment: Performed at [facility], [address] [place], [place] [address]  Lipid panel     Status: Abnormal   Collection Time: 07/25/19  6:03 PM  Result Value Ref Range   Cholesterol 183 0 - 200 mg/dL   Triglycerides 811218 (H) <150 mg/dL   HDL 43 >91>40 mg/dL   Total CHOL/HDL Ratio 4.3 RATIO   VLDL 44 (H) 0 - 40 mg/dL   LDL Cholesterol 96 0 - 99 mg/dL    Comment:        Total Cholesterol/HDL:CHD Risk Coronary Heart Disease Risk Table                     Men   Women  1/2 Average Risk   3.4   3.3  Average Risk       5.0   4.4  2 X Average Risk   9.6   7.1  3 X Average Risk  23.4   11.0        Use the calculated Patient Ratio above and the CHD Risk Table to determine the patient's CHD Risk.        ATP III CLASSIFICATION (LDL):  <100     mg/dL   Optimal  [id number]  mg/dL   Near or Above                    Optimal  130-159  mg/dL   Borderline  [id number]  mg/dL   High  >657>190     mg/dL   Very High Performed at [facility], [address] [place], [place] [address]   Magnesium     Status: None   Collection Time: 07/25/19  6:03 PM  Result Value Ref Range   Magnesium 2.4 1.7 - 2.4 mg/dL    Comment: Performed at [facility], [address] [place], [place] [address]  Prolactin     Status: Abnormal   Collection Time: 07/25/19  6:03 PM  Result Value Ref Range   Prolactin 68.0 (H) 4.0 - 15.2 ng/mL    Comment: (NOTE) Performed At: [facility] [address] [place], [place] [id number] Jolene SchimkeNagendra Sanjai MD UV:[id number]:[phone]   TSH     Status: None   Collection Time: 07/25/19  6:03 PM  Result Value Ref Range   TSH 2.129 0.350 -  4.500 uIU/mL     Comment: Performed by a 3rd Generation assay with a functional sensitivity of <=0.01 uIU/mL. Performed at [facility], [address] [place], [place] [address]     Blood Alcohol level:  Lab Results  Component Value Date   ETH <10 07/25/2019    Metabolic Disorder Labs: Lab Results  Component Value Date   HGBA1C 5.2 07/25/2019   MPG 102.54 07/25/2019   Lab Results  Component Value Date   PROLACTIN 68.0 (H) 07/25/2019   Lab Results  Component Value Date   CHOL 183 07/25/2019   TRIG 218 (H) 07/25/2019   HDL 43 07/25/2019   CHOLHDL 4.3 07/25/2019   VLDL 44 (H) 07/25/2019   LDLCALC 96 07/25/2019    Physical Findings: AIMS:  , ,  ,  ,    CIWA:    COWS:     Musculoskeletal: Strength & Muscle Tone: within normal limits Gait & Station: normal Patient leans: N/A  Psychiatric Specialty Exam: Physical Exam  Vitals reviewed. Constitutional: He is oriented to person, place, and time. He appears well-developed and well-nourished.  HENT:  Head: Normocephalic and atraumatic.  Respiratory: Effort normal.  Neurological: He is alert and oriented to person, place, and time.    Review of Systems  Blood pressure (!) 140/102, pulse 69, temperature (!) 97.5 F (36.4 C), temperature source Oral, resp. rate 18, height 6\' 1"  (1.854 m), weight 83.9 kg, SpO2 100 %.Body mass index is 24.41 kg/m.  General Appearance: Fairly Groomed  Eye Contact:  Fair  Speech:  Normal Rate  Volume:  Decreased  Mood:  Depressed  Affect:  Congruent  Thought Process:  Coherent and Descriptions of Associations: Intact  Orientation:  Full (Time, Place, and Person)  Thought Content:  Logical  Suicidal Thoughts:  No  Homicidal Thoughts:  No  Memory:  Immediate;   Fair Recent;   Good Remote;   Fair  Judgement:  Intact  Insight:  Fair  Psychomotor Activity:  Psychomotor Retardation  Concentration:  Concentration: Fair and Attention Span: Fair  Recall:  of Knowledge:   Fair  Language:  Fair  Akathisia:  Negative  Handed:  Right  AIMS (if indicated):     Assets:  Desire for Improvement Housing Resilience Social Support  ADL's:  Intact  Cognition:  WNL  Sleep:  Number of Hours: 6     Treatment Plan Summary: Daily contact with patient to assess and evaluate symptoms and progress in treatment, Medication management and Plan : Patient is seen and examined.  Patient is a 24 year old male with the above-stated past psychiatric history who is seen in follow-up.   Diagnosis: 1.  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  Cannabis use disorder  Pertinent findings on examination today: 1.  Continue depression, severe with helplessness, hopelessness and worthlessness. 2.  Family history of completed suicide. 3.  Improvement in psychotic symptoms and sleep  Plan: 1.  Continue folic acid 1 mg p.o. daily for nutritional supplementation. 2.  Continue hydroxyzine 25 mg p.o. 3 times daily as needed anxiety. 3.  Continue lorazepam 1 mg p.o. every 6 hours as needed a CIWA greater than 10. 4.  Continue a multivitamin with minerals 1 tablet p.o. daily for nutritional supplementation. 5.  Continue Risperdal 1 mg p.o. daily and 2 mg p.o. nightly for psychosis. 6.  Continue agitation protocol as needed. 7.  Continue Zoloft 25 mg p.o. daily for depression and anxiety.  8.  Continue thiamine 100 mg p.o. daily for nutritional supplementation. 9.  Continue trazodone 50 mg p.o. nightly as needed insomnia. 10.  Reorder urine drug screen. 11.  Disposition planning-in progress.  Sharma Covert, MD 07/26/2019, 11:39 AM

## 2019-07-26 NOTE — Progress Notes (Signed)
NUTRITION ASSESSMENT RD working remotely.   Pt identified as at risk on the Malnutrition Screen Tool  INTERVENTION: - will order 1 tablet multivitamin with minerals/day.  NUTRITION DIAGNOSIS: Unintentional weight loss related to sub-optimal intake as evidenced by pt report.   Goal: Pt to meet >/= 90% of their estimated nutrition needs.  Monitor:  PO intake  Assessment:  Patient admitted for worsening depression and SI x1 week. Per chart review, current weight documented as 185 lb and no other weight hx is available in the chart.    24 y.o. male  Height: Ht Readings from Last 1 Encounters:  07/25/19 6\' 1"  (1.854 m)    Weight: Wt Readings from Last 1 Encounters:  07/25/19 83.9 kg    Weight Hx: Wt Readings from Last 10 Encounters:  07/25/19 83.9 kg    BMI:  Body mass index is 24.41 kg/m. Pt meets criteria for normal weight based on current BMI.  Estimated Nutritional Needs: Kcal: 25-30 kcal/kg Protein: > 1 gram protein/kg Fluid: 1 ml/kcal  Diet Order:  Diet Order            Diet regular Room service appropriate? Yes; Fluid consistency: Thin  Diet effective now             Pt is also offered choice of unit snacks mid-morning and mid-afternoon.  Pt is eating as desired.   Lab results and medications reviewed.     09/24/19, MS, RD, LDN, CNSC Inpatient Clinical Dietitian RD pager # available in [facility]  After hours/weekend pager # available in [facility]

## 2019-07-27 MED ORDER — RISPERIDONE 0.5 MG PO TBDP
0.5000 mg | ORAL_TABLET | Freq: Every day | ORAL | Status: DC
  Administered 2019-07-28 – 2019-07-29 (×2): 0.5 mg via ORAL
  Filled 2019-07-27: qty 7
  Filled 2019-07-27 (×3): qty 1

## 2019-07-27 NOTE — Progress Notes (Signed)
Pt has been quite on approach, stayed in the the all evening. Came for medication and took without issues. Pt denied SI/HI and contracted for safety. Slept well though out the night, remains safe on the unit, will continue to monitor.

## 2019-07-28 MED ORDER — TRAZODONE HCL 100 MG PO TABS
100.0000 mg | ORAL_TABLET | Freq: Every day | ORAL | Status: DC
  Administered 2019-07-28: 100 mg via ORAL
  Filled 2019-07-28 (×3): qty 1
  Filled 2019-07-28: qty 7

## 2019-07-28 NOTE — Progress Notes (Signed)
   07/28/19 2015  COVID-19 Daily Checkoff  Have you had a fever (temp > 37.80C/100F)  in the past 24 hours?  No  COVID-19 EXPOSURE  Have you traveled outside the state in the past 14 days? No  Have you been in contact with someone with a confirmed diagnosis of COVID-19 or PUI in the past 14 days without wearing appropriate PPE? No  Have you been living in the same home as a person with confirmed diagnosis of COVID-19 or a PUI (household contact)? No  Have you been diagnosed with COVID-19? No   

## 2019-07-28 NOTE — Progress Notes (Signed)
[facility] Group Notes:  (Nursing/MHT/Case Management/Adjunct)  Date:  07/28/2019  Time:  2000  Type of Therapy:  wrap up group  Participation Level:  Active  Participation Quality:  Appropriate, Attentive, Sharing and Supportive  Affect:  Depressed and Flat  Cognitive:  Alert  Insight:  Improving  Engagement in Group:  Engaged  Modes of Intervention:  Clarification, Education and Support  Summary of Progress/Problems: Positive thinking and positive change discussed. Pt reports wanting to work on his relationship with his family.   Riley [name] S 07/28/2019, 9:07 PM

## 2019-07-28 NOTE — Progress Notes (Signed)
   07/27/19 2030  COVID-19 Daily Checkoff  Have you had a fever (temp > 37.80C/100F)  in the past 24 hours?  No  COVID-19 EXPOSURE  Have you traveled outside the state in the past 14 days? No  Have you been in contact with someone with a confirmed diagnosis of COVID-19 or PUI in the past 14 days without wearing appropriate PPE? No  Have you been living in the same home as a person with confirmed diagnosis of COVID-19 or a PUI (household contact)? No  Have you been diagnosed with COVID-19? No

## 2019-07-28 NOTE — [facility] Group Notes (Signed)
[facility] Group Notes:  (Nursing/MHT/Case Management/Adjunct)  Date:  07/28/2019  Time:  1:26 AM  Type of Therapy:  Group Therapy  Participation Level:  Active  Participation Quality:  Appropriate  Affect:  Appropriate  Cognitive:  Appropriate  Insight:  Appropriate  Engagement in Group:  Engaged  Modes of Intervention:  Discussion  Summary of Progress/Problems: pt was attentive and appropriate during tonight's wrap up group. Pt shared that he like the change from the other all. Pt shared that its calm. Pt shared that he feels a lot better today. Pt shared that he was able to listen to other peers. Pt stated that he would like to work on anxiety.   Bing Plume D 07/28/2019, 1:26 AM

## 2019-07-28 NOTE — Progress Notes (Signed)
D. Pt has been calm and cooperative on the unit- observed sitting in the dayroom throughout the shift. Per pt's self inventory, pt rated his depression, hopelessness and anxiety a 4/2/4, respectively. Pt writes that his goal today is "learning what I can do to help with unwanted stress in normal situations", and writes that he will "talk and learn helpful skills with others".   Pt currently denies SI/HI and AVH  A. Labs and vitals monitored. Pt compliant with medications. Pt supported emotionally and encouraged to express concerns and ask questions.   R. Pt remains safe with 15 minute checks. Will continue POC.

## 2019-07-28 NOTE — Progress Notes (Addendum)
[facility] MD Progress Note  07/28/2019 2:45 PM Riley [name]  MRN:  [id number]  Subjective: Riley [name] reports: I'm doing well. I'm no longer hearing voices. My anxiety is threading down. I will need my sleep medicines adjusted because I could not sleep well last night. I will need counseling services after discharge. I need to be talking with someone who does counseling to help me deal with my struggles".  Objective: Patient is seen and examined.  Patient is a 24 year old male with the above-stated past psychiatric history who is seen in follow-up.  He stated he is doing better today.  He stated that the auditory hallucinations have either decreased significantly or he is not hearing anything at all.  He stated he did not sleep well last night.  He stated his suicidal ideation had decreased.  He felt like his mood had improved as well.  He denied any side effects to his current medications.  His blood pressure is stable today at 110/74.  Pulse was 74.  He is afebrile.  He slept 6.75 hours last night, however, he reported did not sleep well last night.  Reviewed of admission laboratories revealed essentially normal electrolytes including liver function enzymes.  Lipid panel was normal.  His CBC was normal.  His prolactin was elevated at 68.  Hemoglobin A1c was 5.2.  TSH was 2.129.  Blood alcohol was less than 10.  Drug screen result - pending. Possible discharge in the morning.  Principal Problem: MDD (major depressive disorder) Diagnosis: Principal Problem:   MDD (major depressive disorder) Active Problems:   Psychosis (HCC)  Total Time spent with patient: 15 minutes  Past Psychiatric History: See admission H&P  Past Medical History: History reviewed. No pertinent past medical history. History reviewed. No pertinent surgical history.  Family History: History reviewed. No pertinent family history.  Family Psychiatric  History: See admission H&P  Social History:  Social History   Substance and Sexual  Activity  Alcohol Use Yes   Comment: Occasional 1-2 beers     Social History   Substance and Sexual Activity  Drug Use Yes  . Types: Marijuana   Comment: occasional THC Use    Social History   Socioeconomic History  . Marital status: Single    Spouse name: Not on file  . Number of children: Not on file  . Years of education: Not on file  . Highest education level: Not on file  Occupational History  . Not on file  Tobacco Use  . Smoking status: Current Every Day Smoker    Packs/day: 0.50    Types: Cigarettes  . Smokeless tobacco: Never Used  Substance and Sexual Activity  . Alcohol use: Yes    Comment: Occasional 1-2 beers  . Drug use: Yes    Types: Marijuana    Comment: occasional THC Use  . Sexual activity: Not on file  Other Topics Concern  . Not on file  Social History Narrative  . Not on file   Social Determinants of Health   Financial Resource Strain:   . Difficulty of Paying Living Expenses:   Food Insecurity:   . Worried About Programme researcher, broadcasting/film/video in the Last Year:   . Barista in the Last Year:   Transportation Needs:   . Freight forwarder (Medical):   Marland Kitchen Lack of Transportation (Non-Medical):   Physical Activity:   . Days of Exercise per Week:   . Minutes of Exercise per Session:   Stress:   .  Feeling of Stress :   Social Connections:   . Frequency of Communication with Friends and Family:   . Frequency of Social Gatherings with Friends and Family:   . Attends Religious Services:   . Active Member of Clubs or Organizations:   . Attends Archivist Meetings:   Marland Kitchen Marital Status:    Additional Social History:    Pain Medications: see MAR Prescriptions: see MAR Over the Counter: see MAR History of alcohol / drug use?: Yes Longest period of sobriety (when/how long): states that he has been slowing down on his use of alcohol and marijuana over the past three years Name of Substance 1: alcohol 1 - Age of First Use: UTA 1 -  Amount (size/oz): 1-2 beers 1 - Frequency: occasionally 1 - Duration: since onset 1 - Last Use / Amount: this past weekend Name of Substance 2: marijuana 2 - Age of First Use: UTA 2 - Amount (size/oz): UTA 2 - Frequency: occasional use 2 - Duration: since onset 2 - Last Use / Amount: UTA  Sleep: Good  Appetite:  Fair  Current Medications: Current Facility-Administered Medications  Medication Dose Route Frequency Provider Last Rate Last Admin  . acetaminophen (TYLENOL) tablet 650 mg  650 mg Oral Q6H PRN Sharma Covert, MD      . alum & mag hydroxide-simeth (MAALOX/MYLANTA) 200-200-20 MG/5ML suspension 30 mL  30 mL Oral Q4H PRN Emmaline Kluver, FNP      . folic acid (FOLVITE) tablet 1 mg  1 mg Oral Daily Sharma Covert, MD   1 mg at 07/28/19 0734  . hydrOXYzine (ATARAX/VISTARIL) tablet 25 mg  25 mg Oral TID PRN Emmaline Kluver, FNP   25 mg at 07/27/19 2147  . LORazepam (ATIVAN) tablet 1 mg  1 mg Oral Q6H PRN Sharma Covert, MD      . risperiDONE (RISPERDAL M-TABS) disintegrating tablet 2 mg  2 mg Oral Q8H PRN Sharma Covert, MD       And  . LORazepam (ATIVAN) tablet 1 mg  1 mg Oral PRN Sharma Covert, MD       And  . ziprasidone (GEODON) injection 20 mg  20 mg Intramuscular PRN Sharma Covert, MD      . magnesium hydroxide (MILK OF MAGNESIA) suspension 30 mL  30 mL Oral Daily PRN Emmaline Kluver, FNP      . multivitamin with minerals tablet 1 tablet  1 tablet Oral Daily Sharma Covert, MD   1 tablet at 07/28/19 0735  . risperiDONE (RISPERDAL M-TABS) disintegrating tablet 0.5 mg  0.5 mg Oral Daily Sharma Covert, MD   0.5 mg at 07/28/19 0734  . risperiDONE (RISPERDAL M-TABS) disintegrating tablet 2 mg  2 mg Oral QHS Sharma Covert, MD   2 mg at 07/27/19 2147  . sertraline (ZOLOFT) tablet 25 mg  25 mg Oral Daily Sharma Covert, MD   25 mg at 07/28/19 0735  . thiamine tablet 100 mg  100 mg Oral Daily Sharma Covert, MD   100 mg at 07/28/19 0735  .  traZODone (DESYREL) tablet 50 mg  50 mg Oral QHS PRN Sharma Covert, MD   50 mg at 07/26/19 2126    Lab Results:  No results found for this or any previous visit (from the past 17 hour(s)).  Blood Alcohol level:  Lab Results  Component Value Date   ETH <10 76/28/3151   Metabolic Disorder Labs: Lab Results  Component Value Date   HGBA1C 5.2 07/25/2019   MPG 102.54 07/25/2019   Lab Results  Component Value Date   PROLACTIN 68.0 (H) 07/25/2019   Lab Results  Component Value Date   CHOL 183 07/25/2019   TRIG 218 (H) 07/25/2019   HDL 43 07/25/2019   CHOLHDL 4.3 07/25/2019   VLDL 44 (H) 07/25/2019   LDLCALC 96 07/25/2019   Physical Findings: AIMS:  , ,  ,  ,    CIWA:    COWS:     Musculoskeletal: Strength & Muscle Tone: within normal limits Gait & Station: normal Patient leans: N/A  Psychiatric Specialty Exam: Physical Exam  Vitals reviewed. Constitutional: He is oriented to person, place, and time. He appears well-developed and well-nourished.  HENT:  Head: Normocephalic and atraumatic.  Respiratory: Effort normal.  Neurological: He is alert and oriented to person, place, and time.    Review of Systems  Constitutional: Negative.   HENT: Negative.   Eyes: Negative.   Respiratory: Negative.   Cardiovascular: Negative.   Gastrointestinal: Negative.   Endocrine: Negative for cold intolerance.  Genitourinary: Negative for difficulty urinating.  Musculoskeletal: Negative.   Skin: Negative.   Allergic/Immunologic: Negative for environmental allergies and food allergies.  Neurological: Negative.   Psychiatric/Behavioral: Positive for dysphoric mood and hallucinations (Drug induced (stable)). Negative for agitation, behavioral problems, confusion, decreased concentration ("Improving"), self-injury, sleep disturbance and suicidal ideas. The patient is not nervous/anxious and is not hyperactive.     Blood pressure 110/74, pulse 74, temperature (!) 97.5 F (36.4  C), temperature source Oral, resp. rate 16, height 6\' 1"  (1.854 m), weight 83.9 kg, SpO2 100 %.Body mass index is 24.41 kg/m.  General Appearance: Fairly Groomed  Eye Contact:  Fair  Speech:  Normal Rate  Volume:  Decreased  Mood:  "Improving"  Affect:  Congruent  Thought Process:  Coherent and Descriptions of Associations: Intact  Orientation:  Full (Time, Place, and Person)  Thought Content:  Logical  Suicidal Thoughts:  No  Homicidal Thoughts:  No  Memory:  Immediate;   Fair Recent;   Good Remote;   Fair  Judgement:  Intact  Insight:  Fair  Psychomotor Activity:  Psychomotor Retardation  Concentration:  Concentration: Fair and Attention Span: Fair  Recall:  of Knowledge:  Fair  Language:  Fair  Akathisia:  Negative  Handed:  Right  AIMS (if indicated):     Assets:  Desire for Improvement Housing Resilience Social Support  ADL's:  Intact  Cognition:  WNL  Sleep:  Number of Hours: 6.75   Treatment Plan Summary: Daily contact with patient to assess and evaluate symptoms and progress in treatment, Medication management and Plan : Patient is seen and examined.  Patient is a 24 year old male with the above-stated past psychiatric history who is seen in follow-up.   Continue inpatient hospitalization. Will continue today 07/28/2019 plan as below except where it is noted.  Diagnosis: 1.  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  Cannabis use disorder  Pertinent findings on examination today: 1.  Continue depression, severe with helplessness, hopelessness and worthlessness. 2.  Family history of completed suicide. 3.  Improvement in psychotic symptoms and sleep  Plan: 1.  Continue folic acid 1 mg p.o. daily for nutritional supplementation. 2.  Continue hydroxyzine 25 mg p.o. 3 times daily as needed anxiety. 3.  Continue lorazepam 1 mg p.o. every 6 hours as needed a CIWA greater than 10. 4.  Continue a multivitamin with minerals 1 tablet p.o. daily for nutritional supplementation. 5.  Continue Risperdal 1 mg p.o. daily and 2 mg p.o. nightly for psychosis. 6.  Continue agitation protocol as needed. 7.  Continue Zoloft 25 mg p.o. daily for depression and anxiety. 8.  Continue thiamine 100 mg p.o. daily for nutritional supplementation. 9.  Increased trazodone to 100 mg p.o. nightly as needed insomnia. 10. Reorder urine drug screen. 11. Disposition planning-in progress.  Armandina Stammer, NP, PMHNP, FNP-BC 07/28/2019, 2:45 PMPatient ID: Cydney Ok, male   DOB: 23-Apr-1995, 24 y.o.   MRN: [id number]

## 2019-07-28 NOTE — B[facility] Group Notes (Signed)
[facility] LCSW Group Therapy Note  Date/Time:  07/28/2019 1015  Type of Therapy and Topic:  Group Therapy:  Healthy and Unhealthy Supports  Participation Level:  Active   Description of Group:  Patients in this group were introduced to the idea of adding a variety of healthy supports to address the various needs in their lives.Patients discussed what additional healthy supports could be helpful in their recovery and wellness after discharge in order to prevent future hospitalizations.   An emphasis was placed on using counselor, doctor, therapy groups, 12-step groups, and problem-specific support groups to expand supports.  They also worked as a group on developing a specific plan for several patients to deal with unhealthy supports through boundary-setting, psychoeducation with loved ones, and even termination of relationships.   Therapeutic Goals:   1)  discuss importance of adding supports to stay well once out of the hospital  2)  compare healthy versus unhealthy supports and identify some examples of each  3)  generate ideas and descriptions of healthy supports that can be added  4)  offer mutual support about how to address unhealthy supports  5)  encourage active participation in and adherence to discharge plan    Summary of Patient Progress:  The patient openly engaged in introductory check-in, sharing of feeling "3-4/10; I have difficulties turning to people, social anxiety, talking in front of people, and difficulties expressing how I feel". Pt actively engaged in process discussion surrounding healthy supports and unhealthy supports, providing various examples to include pets. Pt proved agreeable with alternate group members input surrounding healthy and unhealthy supports as well as acknowledging potential of supports to be both health and unhealthy simultaneously. The patient expressed a willingness to add medication, therapists, or alternate available community supports as  support(s) to help in his recovery journey. Pt proved receptive to alternate group members input and feedback from CSW.   Therapeutic Modalities:   Motivational Interviewing Brief Solution-Focused Therapy  Leisa Lenz, LCSWA 07/28/2019  1:00 PM

## 2019-07-28 NOTE — Progress Notes (Signed)
Pt said his goal is to "consciously express himself in a safe manner." Pt spent some of his time in the dayroom and also came to get his medication. He rated his anxiety a 6 for which his PRN vistaril was administered. He denies SI/HI and AVH. Active listening, reassurance, and support provided. Medications administered as ordered by MD. Q 15 minute safety checks continue.    07/27/19 2030  Psych Admission Type (Psych Patients Only)  Admission Status Voluntary  Psychosocial Assessment  Patient Complaints Anxiety  Eye Contact Brief  Facial Expression Flat;Sad  Affect Appropriate to circumstance  Speech Logical/coherent  Interaction Assertive  Motor Activity Other (Comment) (WNL)  Appearance/Hygiene Unremarkable  Behavior Characteristics Cooperative;Appropriate to situation;Calm;Anxious  Mood Depressed;Anxious;Sad  Thought Process  Coherency WDL  Content WDL  Delusions None reported or observed  Perception WDL  Hallucination None reported or observed  Judgment Impaired  Confusion None  Danger to Self  Current suicidal ideation? Denies  Danger to Others  Danger to Others None reported or observed

## 2019-07-29 MED ORDER — RISPERIDONE 0.5 MG PO TBDP: 0.5000 mg | ORAL_TABLET | Freq: Every day | ORAL | 0 refills | Status: DC

## 2019-07-29 MED ORDER — SERTRALINE HCL 25 MG PO TABS: 25.0000 mg | ORAL_TABLET | Freq: Every day | ORAL | 0 refills | Status: DC

## 2019-07-29 MED ORDER — HYDROXYZINE HCL 25 MG PO TABS: 25.0000 mg | ORAL_TABLET | Freq: Three times a day (TID) | ORAL | 0 refills | Status: DC | PRN

## 2019-07-29 MED ORDER — RISPERIDONE 1 MG PO TBDP
2.0000 mg | ORAL_TABLET | Freq: Every day | ORAL | Status: DC
  Filled 2019-07-29: qty 14

## 2019-07-29 MED ORDER — RISPERIDONE 2 MG PO TBDP: 2.0000 mg | ORAL_TABLET | Freq: Every day | ORAL | 0 refills | Status: DC

## 2019-07-29 MED ORDER — TRAZODONE HCL 100 MG PO TABS: 100.0000 mg | ORAL_TABLET | Freq: Every day | ORAL | 0 refills | Status: DC

## 2019-07-29 MED ORDER — RISPERIDONE 1 MG PO TABS
2.0000 mg | ORAL_TABLET | Freq: Every day | ORAL | Status: DC
  Filled 2019-07-29: qty 14

## 2019-07-29 NOTE — Progress Notes (Signed)
Pt reported his anxiety being a 2 or 3 on a scale of 1-10 (10 being the greatest). He said he had a "good" day, went outside, and played basketball. He attended group today about coping skills and identifies playing video games as one of his. Pleasant upon interaction and seen spending time in the dayroom. Denies SI/HI and AVH. Active listening, reassurance, and support provided. Q 15 minute safety checks continue.    07/28/19 2300  Psych Admission Type (Psych Patients Only)  Admission Status Voluntary  Psychosocial Assessment  Patient Complaints Anxiety  Eye Contact Brief  Facial Expression Flat  Affect Appropriate to circumstance  Speech Logical/coherent  Interaction Assertive  Motor Activity Other (Comment) (WNL)  Appearance/Hygiene Unremarkable  Behavior Characteristics Cooperative;Appropriate to situation;Anxious;Calm  Mood Anxious  Thought Process  Coherency WDL  Content WDL  Delusions None reported or observed  Perception WDL  Hallucination None reported or observed  Judgment Impaired  Confusion None  Danger to Self  Current suicidal ideation? Denies  Danger to Others  Danger to Others None reported or observed

## 2019-07-29 NOTE — BHH Suicide Risk Assessment (Signed)
[facility] Discharge Suicide Risk Assessment   Principal Problem: MDD (major depressive disorder) Discharge Diagnoses: Principal Problem:   MDD (major depressive disorder) Active Problems:   Psychosis (HCC)   Total Time spent with patient: 15 minutes  Musculoskeletal: Strength & Muscle Tone: within normal limits Gait & Station: normal Patient leans: N/A  Psychiatric Specialty Exam: Review of Systems  All other systems reviewed and are negative.   Blood pressure 116/67, pulse 89, temperature 97.6 F (36.4 C), temperature source Oral, resp. rate 16, height 6\' 1"  (1.854 m), weight 83.9 kg, SpO2 100 %.Body mass index is 24.41 kg/m.  General Appearance: Casual  Eye Contact::  Fair  Speech:  Normal Rate409  Volume:  Normal  Mood:  Euthymic  Affect:  Congruent  Thought Process:  Coherent and Descriptions of Associations: Intact  Orientation:  Full (Time, Place, and Person)  Thought Content:  Logical  Suicidal Thoughts:  No  Homicidal Thoughts:  No  Memory:  Immediate;   Good Recent;   Good Remote;   Good  Judgement:  Intact  Insight:  Fair  Psychomotor Activity:  Normal  Concentration:  Fair  Recall:  [phone] of Knowledge:Fair  Language: Fair  Akathisia:  Negative  Handed:  Right  AIMS (if indicated):     Assets:  Desire for Improvement Resilience  Sleep:  Number of Hours: 6.25  Cognition: WNL  ADL's:  Intact   Mental Status Per Nursing Assessment::   On Admission:  Suicidal ideation indicated by patient, Self-harm thoughts  Demographic Factors:  Male, Caucasian, Low socioeconomic status, Living alone and Unemployed  Loss Factors: Decrease in vocational status and Financial problems/change in socioeconomic status  Historical Factors: Impulsivity  Risk Reduction Factors:   Sense of responsibility to family and Positive social support  Continued Clinical Symptoms:  Depression:   Impulsivity Alcohol/Substance Abuse/Dependencies  Cognitive Features That  Contribute To Risk:  None    Suicide Risk:  Minimal: No identifiable suicidal ideation.  Patients presenting with no risk factors but with morbid ruminations; may be classified as minimal risk based on the severity of the depressive symptoms  Follow-up Information    [facility].   Contact information: [address] [place] [phone]           Plan Of Care/Follow-up recommendations:  Activity:  ad lib  [phone], MD 07/29/2019, 8:16 AM

## 2019-07-29 NOTE — Progress Notes (Addendum)
Pt discharged to lobby- mom present to pick up patient. Pt was stable and appreciative at that time. All papers, samples, and prescriptions were given and valuables returned. Verbal understanding expressed. Denies SI/HI and A/VH. Pt given opportunity to express concerns and ask questions.

## 2019-07-29 NOTE — Discharge Summary (Signed)
Physician Discharge Summary Note  Patient:  Riley [name] is an 24 y.o., male MRN:  [id number] DOB:  Jul 01, 1995 Patient phone:  [phone] (home)  Patient address:   [address] [place] [address],  Total Time spent with patient: 15 minutes  Date of Admission:  07/25/2019 Date of Discharge: 07/29/19  Reason for Admission:  Depression with auditory hallucinations  Principal Problem: MDD (major depressive disorder) Discharge Diagnoses: Principal Problem:   MDD (major depressive disorder) Active Problems:   Psychosis (HCC)   Past Psychiatric History: History of daily marijuana use and depression. No prior hospitalizations.  Past Medical History: History reviewed. No pertinent past medical history. History reviewed. No pertinent surgical history. Family History: History reviewed. No pertinent family history. Family Psychiatric  History: Father with substance and mental health problems who died by suicide. Social History:  Social History   Substance and Sexual Activity  Alcohol Use Yes   Comment: Occasional 1-2 beers     Social History   Substance and Sexual Activity  Drug Use Yes  . Types: Marijuana   Comment: occasional THC Use    Social History   Socioeconomic History  . Marital status: Single    Spouse name: Not on file  . Number of children: Not on file  . Years of education: Not on file  . Highest education level: Not on file  Occupational History  . Not on file  Tobacco Use  . Smoking status: Current Every Day Smoker    Packs/day: 0.50    Types: Cigarettes  . Smokeless tobacco: Never Used  Substance and Sexual Activity  . Alcohol use: Yes    Comment: Occasional 1-2 beers  . Drug use: Yes    Types: Marijuana    Comment: occasional THC Use  . Sexual activity: Not on file  Other Topics Concern  . Not on file  Social History Narrative  . Not on file   Social Determinants of Health   Financial Resource Strain:   . Difficulty of Paying Living  Expenses:   Food Insecurity:   . Worried About Programme researcher, broadcasting/film/video in the Last Year:   . Barista in the Last Year:   Transportation Needs:   . Freight forwarder (Medical):   Marland Kitchen Lack of Transportation (Non-Medical):   Physical Activity:   . Days of Exercise per Week:   . Minutes of Exercise per Session:   Stress:   . Feeling of Stress :   Social Connections:   . Frequency of Communication with Friends and Family:   . Frequency of Social Gatherings with Friends and Family:   . Attends Religious Services:   . Active Member of Clubs or Organizations:   . Attends Banker Meetings:   Marland Kitchen Marital Status:     Hospital Course:  From admission H&P: Patient is a 24 year old male with a self-reported history of depression, marijuana use and recent onset auditory hallucinations who presented to the behavioral health hospital as a walk-in evaluation with his mother.  The patient stated that he had been suffering depression for the last 5 years.  He stated over the last several months things have gotten much worse.  He stated he began to hear auditory hallucinations.  He stated that he had used marijuana extensively for several years very heavily, and had recently used "delta 8" but that hallucinations were present prior to the delta 8.  He stated that he quit a job because of stressful issues and  went from [place] to live on a farm that the family [place].  He went there to collect himself.  Things did not get any better there.  He stated that in the past he had held a gun to his head and was going to kill himself.  He stated "I just could not do it".  He denied any previous psychiatric admissions.  He did have a previous arrest for possession of marijuana.  He has no court dates.  He denied any previous hospitalizations secondary to substance issues.  During the interview he was quite tearful and sad.  Besides what is noted above other pertinent factors include the fact that his  father committed suicide when the patient was age 61.  The father had a history of psychiatric issues as well as substance abuse issues.  The patient denied any other trauma involving physical, emotional or sexual trauma outside the death of his father.  Because of depression and psychotic symptoms it was decided he should be admitted to the hospital for evaluation and stabilization.  [name] was admitted for depression with auditory hallucinations. He remained on the [facility] unit for four days. He was started on Zoloft, Risperdal, trazodone, and Vistaril. He participated in group therapy on the unit. He responded well to treatment with no adverse effects reported. He has shown improved mood, affect, sleep, and interaction. He denies any SI/HI/AVH and contracts for safety. He states intent to abstain completely from marijuana use due to worsening of mental health symptoms while using. He identifies his mother and one local friend as sources of support. He is future-oriented and expresses plan to follow up with outpatient counseling as well as medication management. He is discharging on the medications listed below. He agrees to follow up at the [facility] (see below). Patient is provided with prescriptions for medications upon discharge. His mother is picking him up for discharge home.  Physical Findings: AIMS:  , ,  ,  ,    CIWA:    COWS:     Musculoskeletal: Strength & Muscle Tone: within normal limits Gait & Station: normal Patient leans: N/A  Psychiatric Specialty Exam: Physical Exam  Nursing note and vitals reviewed. Constitutional: He is oriented to person, place, and time. He appears well-developed and well-nourished.  Cardiovascular: Normal rate.  Respiratory: Effort normal.  Neurological: He is alert and oriented to person, place, and time.    Review of Systems  Constitutional: Negative.   Respiratory: Negative for cough and shortness of breath.   Psychiatric/Behavioral:  Negative for agitation, behavioral problems, confusion, dysphoric mood, hallucinations, self-injury, sleep disturbance and suicidal ideas. The patient is not nervous/anxious and is not hyperactive.     Blood pressure 116/67, pulse 89, temperature 97.6 F (36.4 C), temperature source Oral, resp. rate 16, height 6\' 1"  (1.854 m), weight 83.9 kg, SpO2 100 %.Body mass index is 24.41 kg/m.  See MD's discharge SRA      Has this patient used any form of tobacco in the last 30 days? (Cigarettes, Smokeless Tobacco, Cigars, and/or Pipes)  No  Blood Alcohol level:  Lab Results  Component Value Date   ETH <10 07/25/2019    Metabolic Disorder Labs:  Lab Results  Component Value Date   HGBA1C 5.2 07/25/2019   MPG 102.54 07/25/2019   Lab Results  Component Value Date   PROLACTIN 68.0 (H) 07/25/2019   Lab Results  Component Value Date   CHOL 183 07/25/2019   TRIG 218 (H) 07/25/2019  HDL 43 07/25/2019   CHOLHDL 4.3 07/25/2019   VLDL 44 (H) 07/25/2019   [place] 96 07/25/2019    See Psychiatric Specialty Exam and Suicide Risk Assessment completed by Attending Physician prior to discharge.  Discharge destination:  Home  Is patient on multiple antipsychotic therapies at discharge:  No   Has Patient had three or more failed trials of antipsychotic monotherapy by history:  No  Recommended Plan for Multiple Antipsychotic Therapies: NA  Discharge Instructions    Discharge instructions   Complete by: As directed    Patient is instructed to take all prescribed medications as recommended. Report any side effects or adverse reactions to your outpatient psychiatrist. Patient is instructed to abstain from alcohol and illegal drugs while on prescription medications. In the event of worsening symptoms, patient is instructed to call the crisis hotline, 911, or go to the nearest emergency department for evaluation and treatment.     Allergies as of 07/29/2019   Not on File     Medication List     TAKE these medications     Indication  hydrOXYzine 25 MG tablet Commonly known as: ATARAX/VISTARIL Take 1 tablet (25 mg total) by mouth 3 (three) times daily as needed for anxiety.  Indication: Feeling Anxious   risperiDONE 2 MG disintegrating tablet Commonly known as: RISPERDAL M-TABS Take 1 tablet (2 mg total) by mouth at bedtime.  Indication: Major Depressive Disorder   risperiDONE 0.5 MG disintegrating tablet Commonly known as: RISPERDAL M-TABS Take 1 tablet (0.5 mg total) by mouth daily. Start taking on: July 30, 2019  Indication: Major Depressive Disorder   sertraline 25 MG tablet Commonly known as: ZOLOFT Take 1 tablet (25 mg total) by mouth daily. Start taking on: July 30, 2019  Indication: Major Depressive Disorder   traZODone 100 MG tablet Commonly known as: DESYREL Take 1 tablet (100 mg total) by mouth at bedtime.  Indication: [facility]. Go on 08/28/2019.   Why: Your appointment for medication management is on 08/28/19 at 10:00 am.  This will be a virtual appointment. Please inquire about therapy services during this visit.  Be sure to provide your discharge paperwork, inlcuding list of medications. Contact information: [address] [place] [address] [phone]           Follow-up recommendations: Activity as tolerated. Diet as recommended by primary care physician. Keep all scheduled follow-up appointments as recommended.   Comments:   Patient is instructed to take all prescribed medications as recommended. Report any side effects or adverse reactions to your outpatient psychiatrist. Patient is instructed to abstain from alcohol and illegal drugs while on prescription medications. In the event of worsening symptoms, patient is instructed to call the crisis hotline, 911, or go to the nearest emergency department for evaluation and treatment.  Signed: Connye Burkitt, NP 07/29/2019, 2:09  PM

## 2019-07-29 NOTE — Progress Notes (Addendum)
   07/29/19 0800  Psych Admission Type (Psych Patients Only)  Admission Status Voluntary  Psychosocial Assessment  Patient Complaints None  Eye Contact Brief  Facial Expression Flat  Affect Appropriate to circumstance  Speech Logical/coherent  Interaction Assertive  Motor Activity Other (Comment) (WNL)  Appearance/Hygiene Unremarkable  Behavior Characteristics Cooperative;Appropriate to situation  Mood Euthymic  Aggressive Behavior  Effect No apparent injury  Thought Process  Coherency WDL  Content WDL  Delusions None reported or observed  Perception WDL  Hallucination None reported or observed  Judgment Impaired  Confusion None  Danger to Self  Current suicidal ideation? Denies  Danger to Others  Danger to Others None reported or observed    Pt reports that he is ready to discharge- per pt's self inventory, pt rates his depression, hopelessness and anxiety a 2/2/1, respectively. Pt writes that his goal is "keeping positive thoughts when I leave", and writes that he will "practice my coping skills that I have learned since I got here". Pt denies SI/HI and A/VH

## 2019-07-29 NOTE — [facility] Suicide Risk Assessment (Signed)
[facility] INPATIENT:  Family/Significant Other Suicide Prevention Education  Suicide Prevention Education:  Education Completed; with mom, Riley [name] [place] [phone]) has been identified by the patient as the family member/significant other with whom the patient will be residing, and identified as the person(s) who will aid the patient in the event of a mental health crisis (suicidal ideations/suicide attempt).  With written consent from the patient, the family member/significant other has been provided the following suicide prevention education, prior to the and/or following the discharge of the patient.  The suicide prevention education provided includes the following:  Suicide risk factors  Suicide prevention and interventions  National Suicide Hotline telephone number  [facility] assessment telephone number  [facility] Emergency Assistance 911  [facility] and/or Residential Mobile Crisis Unit telephone number  Request made of family/significant other to:  Remove weapons (e.g., guns, rifles, knives), all items previously/currently identified as safety concern.    Remove drugs/medications (over-the-counter, prescriptions, illicit drugs), all items previously/currently identified as a safety concern.  The family member/significant other verbalizes understanding of the suicide prevention education information provided.  The family member/significant other agrees to remove the items of safety concern listed above.  Riley [name] reports she does not have any questions or concerns regarding her son's discharge. She reports the patient is returning home with her. She states she will ensure that the patient will participate in his outpatient providers. CSW will continue to follow for a safe discharge.   Riley [name] 07/29/2019, 11:48 AM

## 2019-07-29 NOTE — Progress Notes (Signed)
  [facility] Adult Case Management Discharge Plan :  Will you be returning to the same living situation after discharge:  Yes,  patient is returning home with his mother At discharge, do you have transportation home?: Yes,  patient's mother is picking up Do you have the ability to pay for your medications: Yes,  BCBS  Release of information consent forms completed and in the chart;  Patient's signature needed at discharge.  Patient to Follow up at: Follow-up Information    [facility] Go on 08/28/2019.   Why: Your appointment for medication management is on 08/28/19 at 10:00 am.  This will be a virtual appointment. Please inquire about therapy services during this visit.  Be sure to provide your discharge paperwork, inlcuding list of medications. Contact information: [address] [place] [address] [phone]           Next level of care provider has access to [facility] Link:yes  Safety Planning and Suicide Prevention discussed: Yes,  with the patient's mother     Has patient been referred to the Quitline?: Patient refused referral  Patient has been referred for addiction treatment: N/A  Maeola Sarah, LCSWA 07/29/2019, 11:51 AM

## 2019-07-29 NOTE — Progress Notes (Signed)
Recreation Therapy Notes  Date:  6.7.21 Time: 0930 Location: 300 Hall Group Room  Group Topic: Stress Management  Goal Area(s) Addresses:  Patient will identify positive stress management techniques. Patient will identify benefits of using stress management post d/c.  Intervention: Stress Management  Activity: Meditation.  LRT played a meditation that focused on embracing change.  Patients were to listen and follow along as meditation was read to engage in activity.    Education:  Stress Management, Discharge Planning.   Education Outcome: Acknowledges Education  Clinical Observations/Feedback: Pt did not attend activity.    Marshun Duva, LRT/CTRS         Truth Barot A 07/29/2019 11:12 AM 

## 2019-07-30 NOTE — Progress Notes (Signed)
Spiritual care group on grief and loss facilitated by chaplain Burnis Kingfisher MDiv, B[facility]  Group Goal:  Support / Education around grief and loss Members engage in facilitated group support and psycho-social education.  Group Description:  Following introductions and group rules, group members engaged in facilitated group dialog and support around topic of loss, with particular support around experiences of loss in their lives. Group Identified types of loss (relationships / self / things) and identified patterns, circumstances, and changes that precipitate losses. Reflected on thoughts / feelings around loss, normalized grief responses, and recognized variety in grief experience.   Group noted Worden's four tasks of grief in discussion.  Group drew on Adlerian / Rogerian, narrative, MI, Patient Progress:  Riley [name] was present throughout group.  Engaged with group members around grief process - noted that he holds things inside and does not connect to others.  Reflected on his process of coming to [facility] - and received affirmation from group around his courage and persistence.  Riley [name] identified that he is a person "with a big heart" and processed offering care to himself in the ways he offers to others.  Noted he found it helpful to talk about grief and group recommended contacting hospice counselors for continued group space following discharge.

## 2020-06-25 ENCOUNTER — Other Ambulatory Visit: Payer: Self-pay

## 2020-06-25 ENCOUNTER — Encounter (HOSPITAL_COMMUNITY): Payer: Self-pay | Admitting: Psychiatry

## 2020-06-25 ENCOUNTER — Inpatient Hospital Stay (HOSPITAL_COMMUNITY)
Admission: RE | Admit: 2020-06-25 | Discharge: 2020-07-02 | DRG: 885 | Disposition: A | Discharge status: Home / Self Care | Payer: Federal, State, Local not specified - Other | Attending: Psychiatry | Admitting: Psychiatry

## 2020-06-25 DIAGNOSIS — R001 Bradycardia, unspecified: Secondary | ICD-10-CM | POA: Diagnosis present

## 2020-06-25 DIAGNOSIS — Z9151 Personal history of suicidal behavior: Secondary | ICD-10-CM | POA: Diagnosis not present

## 2020-06-25 DIAGNOSIS — F419 Anxiety disorder, unspecified: Secondary | ICD-10-CM | POA: Diagnosis present

## 2020-06-25 DIAGNOSIS — R45851 Suicidal ideations: Secondary | ICD-10-CM | POA: Diagnosis present

## 2020-06-25 DIAGNOSIS — G4700 Insomnia, unspecified: Secondary | ICD-10-CM | POA: Diagnosis present

## 2020-06-25 DIAGNOSIS — Z79899 Other long term (current) drug therapy: Secondary | ICD-10-CM

## 2020-06-25 DIAGNOSIS — F333 Major depressive disorder, recurrent, severe with psychotic symptoms: Principal | ICD-10-CM | POA: Diagnosis present

## 2020-06-25 DIAGNOSIS — F17210 Nicotine dependence, cigarettes, uncomplicated: Secondary | ICD-10-CM | POA: Diagnosis present

## 2020-06-25 DIAGNOSIS — F29 Unspecified psychosis not due to a substance or known physiological condition: Secondary | ICD-10-CM | POA: Diagnosis present

## 2020-06-25 DIAGNOSIS — Z20822 Contact with and (suspected) exposure to covid-19: Secondary | ICD-10-CM | POA: Diagnosis present

## 2020-06-25 DIAGNOSIS — Z818 Family history of other mental and behavioral disorders: Secondary | ICD-10-CM | POA: Diagnosis not present

## 2020-06-25 DIAGNOSIS — F332 Major depressive disorder, recurrent severe without psychotic features: Secondary | ICD-10-CM | POA: Diagnosis present

## 2020-06-25 DIAGNOSIS — F329 Major depressive disorder, single episode, unspecified: Secondary | ICD-10-CM | POA: Diagnosis not present

## 2020-06-25 LAB — RESP PANEL BY RT-PCR (FLU A&B, COVID) ARPGX2
Influenza A by PCR: NEGATIVE
Influenza B by PCR: NEGATIVE
SARS Coronavirus 2 by RT PCR: NEGATIVE

## 2020-06-25 MED ORDER — SERTRALINE HCL 25 MG PO TABS
25.0000 mg | ORAL_TABLET | Freq: Every day | ORAL | Status: DC
  Administered 2020-06-25 – 2020-06-26 (×2): 25 mg via ORAL
  Filled 2020-06-25 (×3): qty 1

## 2020-06-25 MED ORDER — RISPERIDONE 2 MG PO TABS
2.0000 mg | ORAL_TABLET | Freq: Every day | ORAL | Status: DC
  Administered 2020-06-25: 2 mg via ORAL
  Filled 2020-06-25 (×2): qty 1

## 2020-06-25 MED ORDER — HYDROXYZINE HCL 25 MG PO TABS
25.0000 mg | ORAL_TABLET | Freq: Three times a day (TID) | ORAL | Status: DC | PRN
  Administered 2020-06-26 – 2020-06-28 (×3): 25 mg via ORAL
  Filled 2020-06-25 (×3): qty 1

## 2020-06-25 MED ORDER — ACETAMINOPHEN 325 MG PO TABS: 650.0000 mg | ORAL_TABLET | Freq: Four times a day (QID) | ORAL | Status: DC | PRN

## 2020-06-25 MED ORDER — ALUM & MAG HYDROXIDE-SIMETH 200-200-20 MG/5ML PO SUSP: 30.0000 mL | ORAL | Status: DC | PRN

## 2020-06-25 MED ORDER — MAGNESIUM HYDROXIDE 400 MG/5ML PO SUSP: 30.0000 mL | Freq: Every day | ORAL | Status: DC | PRN

## 2020-06-25 MED ORDER — TRAZODONE HCL 50 MG PO TABS
50.0000 mg | ORAL_TABLET | Freq: Every evening | ORAL | Status: DC | PRN
  Administered 2020-06-26 – 2020-06-28 (×3): 50 mg via ORAL
  Filled 2020-06-25 (×3): qty 1

## 2020-06-25 NOTE — Progress Notes (Signed)
Psychoeducational Group Note  Date:  06/25/2020 Time: 2015  Group Topic/Focus:  wrap up group  Participation Level: Did Not Attend  Participation Quality:  Not Applicable  Affect:  Not Applicable  Cognitive:  Not Applicable  Insight:  Not Applicable  Engagement in Group: Not Applicable  Additional Comments:  Pt did not attend.   Johann Capers S 06/25/2020, 9:01 PM

## 2020-06-25 NOTE — Progress Notes (Signed)
Patient ID: Riley [name], male   DOB: Oct 12, 1995, 25 y.o.   MRN: [id number] Patient is a 25 year old Caucasian male admitted under voluntary status after walking into the hospital. Pt reports worsening auditory hallucinations of voices stating that they want to harm him, and states that he has been hearing these voices for the past couple of weeks. Pt denies visual hallucinations and denies suicidal ideations, and homicidal ideations. Pt self reports a diagnoses of Major depressive disorder and anxiety, reports that he was last at [facility] in June 2021. Pt states when he left [facility], he was told to follow up with the mood treatment center, and went once, and has not been consistent with taking his medications. Pt states that he was living with his brother in [place], but is in the process of moving to [place] to be in one of his late mother's property's there. Pt reports that his mother passed away in 2023-02-09 and he is still depressed from losing her. Pt educated on unit rules and protocols, verbalizes understanding, Q15 minute checks initiated for safety.

## 2020-06-25 NOTE — H&P (Addendum)
Behavioral Health Medical Screening Exam  Riley [name] is a 25 y.o. male patient presented voluntarily to [facility] as a walk in alone with complaints of "I am hearing voices".  Chue Adan, 25 y.o., male patient seen face to face by this provider, consulted with Dr. Lucianne Muss; and chart reviewed on 06/25/20   During evaluation Riley [name] is in sitting position in no acute distress. He is alert, oriented x 4, calm cooperative and attentive. He reports his mood as anxious and depressed. His affect is flat.  He has normal speech and behavior.  Objectively there is no evidence of psychosis/mania or delusional thinking.  Patient is able to converse coherently, goal directed thoughts, no distractibility, or pre-occupation. He denies denies homicidal ideation. Endorses suicidal thoughts and paranoia.   Patient reports he stopped taking his medications  Abilify and Zoloft in February 2022. By April 2022 his auditory hallucinations began.  States he restarted medications and they did not help, the voices  continued to get worse.  States now he has multiple voices.  States," the voices tell me to harm myself".  States sometimes the voices sound like family but they are constant and he cannot function. Reports he has become more paranoid at home.  He believes people are trying to break. He does not stay at his house. States he drives around all night because he is afraid to go home.  Reports one suicide attempt in the past where he held a gun to his head. States he does not have access to guns.  However he says the guns are at his grandmas house and she doesn't know they are there. Reports his father committed suicide when he was 67 years old.   Patient has a history of MDD with psychosis. States he use to  followed up with Charlestine Night at the [facility] in [place] .  States he had one inpatient psychiatric admission at the [facility] in June 2021. States his most recent out patient follow up was with a  Therapist, sports in [place], he could not remember providers name.   Denies any alcohol or illegal substances use.   Paranoid, suicidal impulsve flat depressed anxiosu audiot   Total Time spent with patient: 30 minutes    Psychiatric Specialty Exam:  Presentation  General Appearance: Disheveled  Eye Contact:Minimal  Speech:Clear and Coherent; Normal Rate  Speech Volume:Normal  Handedness:Right   Mood and Affect  Mood:Depressed; Anxious  Affect:Congruent; Flat   Thought Process  Thought Processes:Coherent  Descriptions of Associations:Intact  Orientation:Full (Time, Place and Person)  Thought Content:Logical  History of Schizophrenia/Schizoaffective disorder:No data recorded Duration of Psychotic Symptoms:No data recorded Hallucinations:Hallucinations: Auditory Description of Auditory Hallucinations: mutiliple voices tell him to hurt his self  Ideas of Reference:Paranoia  Suicidal Thoughts:Suicidal Thoughts: Yes, Passive SI Passive Intent and/or Plan: Without Intent; Without Plan; With Access to Means  Homicidal Thoughts:Homicidal Thoughts: No   Sensorium  Memory:Immediate Good; Recent Good; Remote Good  Judgment:Fair  Insight:Fair   Executive Functions  Concentration:Fair  Attention Span:Fair  Recall:Good  Fund of Knowledge:Good  Language:Good   Psychomotor Activity  Psychomotor Activity:Psychomotor Activity: Normal   Assets  Assets:Communication Skills; Desire for Improvement; Financial Resources/Insurance; Housing; Physical Health; Resilience; Social Support   Sleep  Sleep:Sleep: Poor Number of Hours of Sleep: 0   Physical Exam: Physical Exam Vitals reviewed.  HENT:     Head: Normocephalic.     Right Ear: Tympanic membrane normal.     Left Ear: Tympanic membrane normal.  Nose: Nose normal.     Mouth/Throat:     Mouth: Mucous membranes are dry.     Pharynx: Oropharynx is clear.  Eyes:     Conjunctiva/sclera:  Conjunctivae normal.  Cardiovascular:     Rate and Rhythm: Normal rate.     Pulses: Normal pulses.  Pulmonary:     Effort: Pulmonary effort is normal.  Abdominal:     Tenderness: There is no guarding.  Musculoskeletal:        General: Normal range of motion.     Cervical back: Normal range of motion.  Skin:    General: Skin is warm and dry.     Capillary Refill: Capillary refill takes less than 2 seconds.  Neurological:     Mental Status: He is alert and oriented to person, place, and time.  Psychiatric:        Attention and Perception: Attention normal. He perceives auditory hallucinations.        Mood and Affect: Mood is anxious and depressed. Affect is flat.        Speech: Speech normal.        Behavior: Behavior is not agitated, aggressive, withdrawn, hyperactive or combative. Behavior is cooperative.        Thought Content: Thought content is paranoid. Thought content is not delusional. Thought content includes suicidal ideation. Thought content does not include homicidal ideation. Thought content does not include homicidal or suicidal plan.        Cognition and Memory: Cognition normal.        Judgment: Judgment is impulsive.    Review of Systems  Constitutional: Negative.   HENT: Negative.   Eyes: Negative.   Respiratory: Negative.   Cardiovascular: Negative.   Gastrointestinal: Negative.   Genitourinary: Negative.   Musculoskeletal: Negative.   Skin: Negative.   Neurological: Negative.   Endo/Heme/Allergies: Negative.   Psychiatric/Behavioral: Positive for depression, hallucinations and suicidal ideas. The patient is nervous/anxious.    There were no vitals taken for this visit. There is no height or weight on file to calculate BMI.  Musculoskeletal: Strength & Muscle Tone: within normal limits Gait & Station: normal Patient leans: N/A  Recommendations:  Based on my evaluation the patient does not appear to have an emergency medical condition.   Patient  meets criteria for inpatient psychiatric admission.   Ardis Hughs, NP 06/25/2020, 1:09 PM
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
## 2020-06-26 LAB — CBC
HCT: 49.8 % (ref 39.0–52.0)
Hemoglobin: 16.3 g/dL (ref 13.0–17.0)
MCH: 29.7 pg (ref 26.0–34.0)
MCHC: 32.7 g/dL (ref 30.0–36.0)
MCV: 90.9 fL (ref 80.0–100.0)
Platelets: 184 10*3/uL (ref 150–400)
RBC: 5.48 MIL/uL (ref 4.22–5.81)
RDW: 12.8 % (ref 11.5–15.5)
WBC: 7 10*3/uL (ref 4.0–10.5)
nRBC: 0 % (ref 0.0–0.2)

## 2020-06-26 LAB — LIPID PANEL
Cholesterol: 187 mg/dL (ref 0–200)
HDL: 39 mg/dL — ABNORMAL LOW (ref >40)
LDL Cholesterol: 123 mg/dL — ABNORMAL HIGH (ref 0–99)
Total CHOL/HDL Ratio: 4.8 RATIO
Triglycerides: 127 mg/dL (ref <150)
VLDL: 25 mg/dL (ref 0–40)

## 2020-06-26 LAB — COMPREHENSIVE METABOLIC PANEL
ALT: 23 U/L (ref 0–44)
AST: 17 U/L (ref 15–41)
Albumin: 4.6 g/dL (ref 3.5–5.0)
Alkaline Phosphatase: 67 U/L (ref 38–126)
Anion gap: 11 (ref 5–15)
BUN: 12 mg/dL (ref 6–20)
CO2: 23 mmol/L (ref 22–32)
Calcium: 9.5 mg/dL (ref 8.9–10.3)
Chloride: 107 mmol/L (ref 98–111)
Creatinine, Ser: 0.99 mg/dL (ref 0.61–1.24)
GFR, Estimated: >60 mL/min (ref >60)
Glucose, Bld: 83 mg/dL (ref 70–99)
Potassium: 4 mmol/L (ref 3.5–5.1)
Sodium: 141 mmol/L (ref 135–145)
Total Bilirubin: 0.5 mg/dL (ref 0.3–1.2)
Total Protein: 7.9 g/dL (ref 6.5–8.1)

## 2020-06-26 LAB — RAPID URINE DRUG SCREEN, HOSP PERFORMED
Amphetamines: NOT DETECTED
Barbiturates: NOT DETECTED
Benzodiazepines: NOT DETECTED
Cocaine: NOT DETECTED
Opiates: NOT DETECTED
Tetrahydrocannabinol: NOT DETECTED

## 2020-06-26 LAB — HEMOGLOBIN A1C
Hgb A1c MFr Bld: 5 % (ref 4.8–5.6)
Mean Plasma Glucose: 96.8 mg/dL

## 2020-06-26 LAB — TSH: TSH: 0.992 u[IU]/mL (ref 0.350–4.500)

## 2020-06-26 MED ORDER — RISPERIDONE 1 MG PO TABS
1.0000 mg | ORAL_TABLET | Freq: Every day | ORAL | Status: DC
  Administered 2020-06-26 – 2020-06-28 (×3): 1 mg via ORAL
  Filled 2020-06-26 (×6): qty 1

## 2020-06-26 MED ORDER — SERTRALINE HCL 50 MG PO TABS
50.0000 mg | ORAL_TABLET | Freq: Every day | ORAL | Status: DC
  Administered 2020-06-27 – 2020-06-28 (×2): 50 mg via ORAL
  Filled 2020-06-26 (×5): qty 1

## 2020-06-26 MED ORDER — RISPERIDONE 3 MG PO TABS
3.0000 mg | ORAL_TABLET | Freq: Every day | ORAL | Status: DC
  Administered 2020-06-26 – 2020-07-01 (×6): 3 mg via ORAL
  Filled 2020-06-26 (×4): qty 1
  Filled 2020-06-26: qty 3
  Filled 2020-06-26: qty 7
  Filled 2020-06-26 (×4): qty 1

## 2020-06-26 MED ORDER — SERTRALINE HCL 25 MG PO TABS
25.0000 mg | ORAL_TABLET | Freq: Once | ORAL | Status: AC
  Administered 2020-06-26: 25 mg via ORAL
  Filled 2020-06-26: qty 1

## 2020-06-26 MED ORDER — DIPHENHYDRAMINE HCL 50 MG/ML IJ SOLN: 25.0000 mg | Freq: Four times a day (QID) | INTRAMUSCULAR | Status: DC | PRN

## 2020-06-26 MED ORDER — BENZTROPINE MESYLATE 0.5 MG PO TABS
0.5000 mg | ORAL_TABLET | Freq: Four times a day (QID) | ORAL | Status: DC | PRN
  Filled 2020-06-26: qty 15

## 2020-06-26 NOTE — H&P (Signed)
Psychiatric Admission Assessment Adult  Patient Identification: Riley [name] MRN:  [id number] Date of Evaluation:  06/26/2020 Chief Complaint:  MDD (major depressive disorder), recurrent episode, severe ([place]) [F33.2] Principal Diagnosis: Major depressive disorder, recurrent, severe with psychotic features ([place]) Diagnosis:  Principal Problem:   Major depressive disorder, recurrent, severe with psychotic features ([place]) Active Problems:   MDD (major depressive disorder)   Psychosis ([place])  History of Present Illness: Medical record reviewed.  Patient's case discussed in detail with members of the treatment team.  I met with and evaluated the patient in the office on the unit today. Riley [name] is a 25 year old male with a history of recurrent major depressive disorder with psychotic features as well as a history of past marijuana use and recent alcohol use who presented voluntarily to [facility] as a walk-in alone reporting a chief complaint of "I am hearing voices."  The patient was previously admitted to [facility] with similar symptoms in June 2021 and was diagnosed with major depressive disorder with psychotic features and discharged on a combination of risperidone and sertraline.  Patient states that he did well on these medications and then his mother died in 07-Mar-2020 and he started to become more depressed.  The patient stopped taking his medications in May 08, 2020 and sometime in April he started to hear voices again.  The patient resumed taking risperidone 2 mg at bedtime at the end of April but states that thus far it has not helped reduce the voices.  Patient reports that he has missed some risperidone doses since he restarted his medications.  Patient experiences auditory hallucinations of voices of family members and others making statements that they want to kill the patient and that he cannot hide.  They also tell patient to kill himself.  States that the voices talk about patient and comment on what  he does.  He at times also feels like he can read the thoughts of others.  Patient has paranoia that people are after him and trying to kill him.  This paranoia has resulted in difficulty sleeping and he has at times spent the night driving in his car due to feeling anxious, paranoid and unsafe.  Reports symptoms of depressed mood, decreased appetite, anxiety, anhedonia, low energy, decreased concentration, guilt, sleep disturbance (mostly insomnia) and feeling slowed down.  He denies that he wants to die or wants to her kill himself.  He denies feeling compelled to act on statements made by the voices he hears.  The patient denies VH, AI, HI.  He denies any recent drug use.  He had been drinking alcohol following the death of his mother in 05/09/19 and this increased to between 6 and 12 beers a day.  When he resumed taking his risperidone about 10 days ago he significantly decreased his alcohol use and has been drinking 1-2 beers approximately every other day.  The patient denies any symptoms of alcohol withdrawal.  He denies other drug use since he was discharged from [facility] in June 2021.   Reports a family history of suicide in his father and a paternal uncle.  Patient's states that his father committed suicide when the patient was 25 years old.  The patient's father had symptoms of depression.  Medical records reviewed indicate that patient's father may also have had a substance use issue.  The patient denies any other known family psychiatric history.  He denies any history of physical or sexual abuse.  Patient reports that he had a concussion at age  15 while snowboarding.  He denies any other medical history including any history of surgeries or seizure.  Patient presents as depressed during our conversation.  He appears tired with constricted affect.  He expresses desire to restart sertraline and is willing to continue risperidone and increase the dose to target his hallucinations.  He reports that he has  access to firearms but he gave them to his grandmother to secure for him and they are now stored at his grandmother's house.  Associated Signs/Symptoms: Depression Symptoms:  depressed mood, anhedonia, insomnia, fatigue, feelings of worthlessness/guilt, difficulty concentrating, recurrent thoughts of death, anxiety, disturbed sleep, Duration of Depression Symptoms: Greater than two weeks  (Hypo) Manic Symptoms:  Impulsivity, Insomnia, paranoid ideation Anxiety Symptoms:  Excessive Worry, Anxiety about others trying to harm him Psychotic Symptoms:  Hallucinations: Auditory Paranoia, PTSD Symptoms: Negative Total Time spent with patient: 50 minutes  Past Psychiatric History: The patient reports prior diagnosis of major depressive disorder with psychotic features.  He has a history of 1 prior psychiatric inpatient admission from 07/25/2019 until 07/29/2019 at [facility] for depression and auditory hallucinations as described above.  The patient denies other medication trials.  Riley [name] at the mood treatment center in [place] prescribes his medications.  The patient states that he has had intermittent depression since approximately the age of 25 years old when his mother started to date a man that patient thought was not very nice.  The patient has a history of 1 near suicide attempt at age 65 when he was depressed.  At that time he held a gun to his head but did not follow through and did not pull the trigger.  The patient did not receive any treatment for his mental health issues until June 2021 when he was hospitalized at [facility].  He denies any history of past periods meeting criteria for hypomanic or manic episodes.   Is the patient at risk to self? Yes.    Has the patient been a risk to self in the past 6 months? Yes.    Has the patient been a risk to self within the distant past? Yes.    Is the patient a risk to others? No.  Has the patient been a risk to others in the past 6 months?  No.  Has the patient been a risk to others within the distant past? No.   Prior Inpatient Therapy:   Prior Outpatient Therapy:    Alcohol Screening: 1. How often do you have a drink containing alcohol?: Monthly or less 2. How many drinks containing alcohol do you have on a typical day when you are drinking?: 1 or 2 3. How often do you have six or more drinks on one occasion?: Never AUDIT-C Score: 1 Substance Abuse History in the last 12 months:  Yes.   He denies any recent drug use.    Patient reports he had been drinking alcohol following the death of his mother in 06-May-2019 and this increased to between 6 and 12 beers a day.  When he resumed taking his risperidone about 10 days ago he significantly decreased his alcohol use and has been drinking 1-2 beers approximately every other day.  The patient denies any symptoms of alcohol withdrawal.    The patient states that he used to be a heavy marijuana user but stopped smoking marijuana when he was hospitalized in June 2021 with auditory hallucinations.  He has not smoked any marijuana for the last 1 to 2 months.  Chart notes  indicate there was a history of delta 8 use prior to the June 2021 admission as well.  Patient denies recent delta 8 use.  He denies other drug use since he was discharged from [facility] in June 2021.  Consequences of Substance Abuse: Alcohol use and any other substance use may be contributing to depressive symptoms.  Marijuana use and delta 8 use in the past may have contributed to psychotic symptoms. Previous Psychotropic Medications: Yes  Psychological Evaluations: Yes  Past Medical History: History reviewed. No pertinent past medical history. History reviewed. No pertinent surgical history. Family History: History reviewed. No pertinent family history. Family Psychiatric  History: Reports a family history of suicide in his father and a paternal uncle.  Patient's states that his father committed suicide when the patient was 25 years old.   The patient's father had symptoms of depression.  Medical records reviewed indicate that patient's father may also have had a substance use issue.  The patient denies any other known family psychiatric history.   Tobacco Screening:   Social History:  Social History   Substance and Sexual Activity  Alcohol Use Yes   Comment: Occasional 1-2 beers     Social History   Substance and Sexual Activity  Drug Use Yes  . Types: Marijuana   Comment: occasional THC Use    Additional Social History: Marital status: Single Are you sexually active?:  (unknown) What is your sexual orientation?: Heterosexual Has your sexual activity been affected by drugs, alcohol, medication, or emotional stress?: "Maybe emotional stress" Does patient have children?: No    Pain Medications: see MAR Prescriptions: see MAR Over the Counter: see MAR History of alcohol / drug use?: Yes Longest period of sobriety (when/how long): states that he has been slowing down on his use of alcohol and marijuana over the past three years Name of Substance 1: Alcohol 1 - Age of First Use: unknown 1 - Amount (size/oz): 6 pack -12 pack of beer 1 - Frequency: daily 1 - Duration: on-gong 1 - Last Use / Amount: "I stopped drinking Apirl 2022 after the voices started coming back" 1 - Method of Aquiring: local stores 1- Route of Use: oral Name of Substance 2: THC 2 - Age of First Use: unknown 2 - Amount (size/oz): varied 2 - Frequency: occasional use 2 - Duration: on-going 2 - Last Use / Amount: "O s 2 - Method of Aquiring: "I stopped drinking Apirl 2022 after the voices started coming back" 2 - Route of Substance Use: inhalation; smoking                Allergies:  No Known Allergies Lab Results:  Results for orders placed or performed during the hospital encounter of 06/25/20 (from the past 48 hour(s))  Resp Panel by RT-PCR (Flu A&B, Covid) Nasopharyngeal Swab     Status: None   Collection Time: 06/25/20  1:47 PM    Specimen: Nasopharyngeal Swab; Nasopharyngeal(NP) swabs in vial transport medium  Result Value Ref Range   SARS Coronavirus 2 by RT PCR NEGATIVE NEGATIVE    Comment: (NOTE) SARS-CoV-2 target nucleic acids are NOT DETECTED.  The SARS-CoV-2 RNA is generally detectable in upper respiratory specimens during the acute phase of infection. The lowest concentration of SARS-CoV-2 viral copies this assay can detect is 138 copies/mL. A negative result does not preclude SARS-Cov-2 infection and should not be used as the sole basis for treatment or other patient management decisions. A negative result may occur with  improper specimen collection/handling,  submission of specimen other than nasopharyngeal swab, presence of viral mutation(s) within the areas targeted by this assay, and inadequate number of viral copies(<138 copies/mL). A negative result must be combined with clinical observations, patient history, and epidemiological information. The expected result is Negative.  Fact Sheet for Patients:  EntrepreneurPulse.com.au  Fact Sheet for Healthcare Providers:  IncredibleEmployment.be  This test is no t yet approved or cleared by the [place] FDA and  has been authorized for detection and/or diagnosis of SARS-CoV-2 by FDA under an Emergency Use Authorization (EUA). This EUA will remain  in effect (meaning this test can be used) for the duration of the COVID-19 declaration under Section 564(b)(1) of the Act, 21 U.S.C.section 360bbb-3(b)(1), unless the authorization is terminated  or revoked sooner.       Influenza A by PCR NEGATIVE NEGATIVE   Influenza B by PCR NEGATIVE NEGATIVE    Comment: (NOTE) The Xpert Xpress SARS-CoV-2/FLU/RSV plus assay is intended as an aid in the diagnosis of influenza from Nasopharyngeal swab specimens and should not be used as a sole basis for treatment. Nasal washings and aspirates are unacceptable for Xpert Xpress  SARS-CoV-2/FLU/RSV testing.  Fact Sheet for Patients: EntrepreneurPulse.com.au  Fact Sheet for Healthcare Providers: IncredibleEmployment.be  This test is not yet approved or cleared by the [place] FDA and has been authorized for detection and/or diagnosis of SARS-CoV-2 by FDA under an Emergency Use Authorization (EUA). This EUA will remain in effect (meaning this test can be used) for the duration of the COVID-19 declaration under Section 564(b)(1) of the Act, 21 U.S.C. section 360bbb-3(b)(1), unless the authorization is terminated or revoked.  Performed at [facility], [place] [address], [place], [place] [address]   Urine rapid drug screen (hosp performed)not at [facility]     Status: None   Collection Time: 06/26/20  6:17 AM  Result Value Ref Range   Opiates NONE DETECTED NONE DETECTED   Cocaine NONE DETECTED NONE DETECTED   Benzodiazepines NONE DETECTED NONE DETECTED   Amphetamines NONE DETECTED NONE DETECTED   Tetrahydrocannabinol NONE DETECTED NONE DETECTED   Barbiturates NONE DETECTED NONE DETECTED    Comment: (NOTE) DRUG SCREEN FOR MEDICAL PURPOSES ONLY.  IF CONFIRMATION IS NEEDED FOR ANY PURPOSE, NOTIFY LAB WITHIN 5 DAYS.  LOWEST DETECTABLE LIMITS FOR URINE DRUG SCREEN Drug Class                     Cutoff (ng/mL) Amphetamine and metabolites    1000 Barbiturate and metabolites    200 Benzodiazepine                 191 Tricyclics and metabolites     300 Opiates and metabolites        300 Cocaine and metabolites        300 THC                            50 Performed at [facility], [place] [address] [place], [place] [address]   Hemoglobin A1c     Status: None   Collection Time: 06/26/20  6:29 AM  Result Value Ref Range   Hgb A1c MFr Bld 5.0 4.8 - 5.6 %    Comment: (NOTE) Pre diabetes:          5.7%-6.4%  Diabetes:              >6.4%  Glycemic control for   <7.0% adults  with  diabetes    Mean Plasma Glucose 96.8 mg/dL    Comment: Performed at [facility], [place] [address] [place], [place] [address]  CBC     Status: None   Collection Time: 06/26/20  6:29 AM  Result Value Ref Range   WBC 7.0 4.0 - 10.5 K/uL   RBC 5.48 4.22 - 5.81 MIL/uL   Hemoglobin 16.3 13.0 - 17.0 g/dL   HCT 49.8 39.0 - 52.0 %   MCV 90.9 80.0 - 100.0 fL   MCH 29.7 26.0 - 34.0 pg   MCHC 32.7 30.0 - 36.0 g/dL   RDW 12.8 11.5 - 15.5 %   Platelets 184 150 - 400 K/uL   nRBC 0.0 0.0 - 0.2 %    Comment: Performed at [facility], [place] [address], [place], [place] [address]  Lipid panel     Status: Abnormal   Collection Time: 06/26/20  6:29 AM  Result Value Ref Range   Cholesterol 187 0 - 200 mg/dL   Triglycerides 127 <150 mg/dL   HDL 39 (L) >40 mg/dL   Total CHOL/HDL Ratio 4.8 RATIO   VLDL 25 0 - 40 mg/dL   LDL Cholesterol 123 (H) 0 - 99 mg/dL    Comment:        Total Cholesterol/HDL:CHD Risk Coronary Heart Disease Risk Table                     Men   Women  1/2 Average Risk   3.4   3.3  Average Risk       5.0   4.4  2 X Average Risk   9.6   7.1  3 X Average Risk  23.4   11.0        Use the calculated Patient Ratio above and the CHD Risk Table to determine the patient's CHD Risk.        ATP III CLASSIFICATION (LDL):  <100     mg/dL   Optimal  100-129  mg/dL   Near or Above                    Optimal  130-159  mg/dL   Borderline  160-189  mg/dL   High  >190     mg/dL   Very High Performed at [place] [address], [place], [place] [address]   TSH     Status: None   Collection Time: 06/26/20  6:29 AM  Result Value Ref Range   TSH 0.992 0.350 - 4.500 uIU/mL    Comment: Performed by a 3rd Generation assay with a functional sensitivity of <=0.01 uIU/mL. Performed at [facility], [place] [address], [place],  [address]   Comprehensive metabolic panel     Status: None   Collection Time: 06/26/20  6:29 AM   Result Value Ref Range   Sodium 141 135 - 145 mmol/L   Potassium 4.0 3.5 - 5.1 mmol/L   Chloride 107 98 - 111 mmol/L   CO2 23 22 - 32 mmol/L   Glucose, Bld 83 70 - 99 mg/dL    Comment: Glucose reference range applies only to samples taken after fasting for at least 8 hours.   BUN 12 6 - 20 mg/dL   Creatinine, Ser 0.99 0.61 - 1.24 mg/dL   Calcium 9.5 8.9 - 10.3 mg/dL   Total Protein 7.9 6.5 - 8.1 g/dL   Albumin 4.6 3.5 - 5.0 g/dL  AST 17 15 - 41 U/L   ALT 23 0 - 44 U/L   Alkaline Phosphatase 67 38 - 126 U/L   Total Bilirubin 0.5 0.3 - 1.2 mg/dL   GFR, Estimated >60 >60 mL/min    Comment: (NOTE) Calculated using the CKD-EPI Creatinine Equation (2021)    Anion gap 11 5 - 15    Comment: Performed at [facility], [place] [address] [place], [place] [address]    Blood Alcohol level:  Lab Results  Component Value Date   ETH <10 80/32/1224    Metabolic Disorder Labs:  Lab Results  Component Value Date   HGBA1C 5.0 06/26/2020   MPG 96.8 06/26/2020   MPG 102.54 07/25/2019   Lab Results  Component Value Date   PROLACTIN 68.0 (H) 07/25/2019   Lab Results  Component Value Date   CHOL 187 06/26/2020   TRIG 127 06/26/2020   HDL 39 (L) 06/26/2020   CHOLHDL 4.8 06/26/2020   VLDL 25 06/26/2020   LDLCALC 123 (H) 06/26/2020   LDLCALC 96 07/25/2019    Current Medications: Current Facility-Administered Medications  Medication Dose Route Frequency Provider Last Rate Last Admin  . acetaminophen (TYLENOL) tablet 650 mg  650 mg Oral Q6H PRN Sharma Covert, MD      . alum & mag hydroxide-simeth (MAALOX/MYLANTA) 200-200-20 MG/5ML suspension 30 mL  30 mL Oral Q4H PRN Sharma Covert, MD      . benztropine (COGENTIN) tablet 0.5 mg  0.5 mg Oral Q6H PRN Arthor Captain, MD      . diphenhydrAMINE (BENADRYL) injection 25 mg  25 mg Intramuscular Q6H PRN Arthor Captain, MD      . hydrOXYzine (ATARAX/VISTARIL) tablet 25 mg  25 mg Oral TID PRN Sharma Covert, MD       . magnesium hydroxide (MILK OF MAGNESIA) suspension 30 mL  30 mL Oral Daily PRN Sharma Covert, MD      . risperiDONE (RISPERDAL) tablet 1 mg  1 mg Oral Daily Arthor Captain, MD   1 mg at 06/26/20 1247  . risperiDONE (RISPERDAL) tablet 3 mg  3 mg Oral QHS Arthor Captain, MD      . Derrill Memo ON 06/27/2020] sertraline (ZOLOFT) tablet 50 mg  50 mg Oral Daily Arthor Captain, MD      . traZODone (DESYREL) tablet 50 mg  50 mg Oral QHS PRN Sharma Covert, MD       PTA Medications: Medications Prior to Admission  Medication Sig Dispense Refill Last Dose  . risperiDONE (RISPERDAL M-TABS) 2 MG disintegrating tablet Take 1 tablet (2 mg total) by mouth at bedtime. 30 tablet 0     Musculoskeletal: Strength & Muscle Tone: within normal limits Gait & Station: normal Patient leans: N/A            Psychiatric Specialty Exam:  Presentation  General Appearance: Disheveled; Casual  Eye Contact:Fair  Speech:Clear and Coherent; Normal Rate  Speech Volume:Normal  Handedness:Right   Mood and Affect  Mood:Depressed  Affect:Constricted   Thought Process  Thought Processes:Coherent; Goal Directed  Duration of Psychotic Symptoms: Less than six months  Past Diagnosis of Schizophrenia or Psychoactive disorder: No  Descriptions of Associations:Intact  Orientation:Full (Time, Place and Person)  Thought Content:Paranoid Ideation  Hallucinations:Hallucinations: Auditory Description of Auditory Hallucinations: voices telling pt to kill himself.  States he does not want/does not have to act on voices  Ideas of Reference:Paranoia  Suicidal Thoughts:Suicidal Thoughts: No (denies desire to  die or to kill self but reports AH telling him to kill self) SI Passive Intent and/or Plan: Without Intent; Without Plan; With Access to Means  Homicidal Thoughts:Homicidal Thoughts: No   Sensorium  Memory:Immediate Good; Recent Good; Remote  Good  Judgment:Fair  Insight:Fair   Executive Functions  Concentration:Fair  Attention Span:Fair  Recall:Good  Fund of Knowledge:Good  Language:Good   Psychomotor Activity  Psychomotor Activity:Psychomotor Activity: Normal   Assets  Assets:Communication Skills; Desire for Improvement; Housing; Physical Health; Resilience; Social Support   Sleep  Sleep:Sleep: Poor Number of Hours of Sleep: 0    Physical Exam: Physical Exam ROS Blood pressure 131/83, pulse (!) 54, temperature 97.7 F (36.5 C), temperature source Oral, resp. rate 16, height 6' 1"  (1.854 m), weight 88 kg, SpO2 100 %. Body mass index is 25.6 kg/m.  Treatment Plan Summary: Patient's current symptoms and presentation are consistent with a diagnosis of major depressive disorder recurrent, current episode severe with psychotic features.    Cannot rule out bipolar depression with psychotic features but patient gives no clear history of prior hypomanic or manic episodes.  Daily contact with patient to assess and evaluate symptoms and progress in treatment and Medication management  Observation Level/Precautions:  15 minute checks  Laboratory:  CBC Chemistry Profile HbAIC UDS Lipid panel, TSH. Available lab results from a.m. draw on 06/26/2020 include CMP which was WNL.  Lipid panel revealed LDL of 123, HDL of 39 and otherwise WNL.  CBC was WNL.  Hemoglobin A1c was 5.0.  TSH was 0.992.  Urine drug screen was negative.    EKG revealed sinus bradycardia with a ventricular rate of 50 and QT/QTc of 406/370 with early repolarization.    Psychotherapy: Encouraged participation in group therapy and therapeutic milieu  Medications: We will increase risperidone to 1 mg every morning and 3 mg nightly to target psychotic symptoms.  Will increase sertraline to 50 mg daily to target depressive symptoms.  See MAR for additional details.  Consultations:    Discharge Concerns:    Estimated LOS: Estimated length of stay 3  to 5 days  Other:     Physician Treatment Plan for Primary Diagnosis: Major depressive disorder, recurrent, severe with psychotic features ([place]) Long Term Goal(s): Improvement in symptoms so as ready for discharge  Short Term Goals: Ability to identify changes in lifestyle to reduce recurrence of condition will improve, Ability to verbalize feelings will improve, Ability to disclose and discuss suicidal ideas, Ability to demonstrate self-control will improve, Ability to identify and develop effective coping behaviors will improve, Compliance with prescribed medications will improve and Ability to identify triggers associated with substance abuse/mental health issues will improve   I certify that inpatient services furnished can reasonably be expected to improve the patient's condition.    Arthor Captain, MD 5/6/20221:25 PM

## 2020-06-26 NOTE — Progress Notes (Signed)
Recreation Therapy Notes  Date:  5.6.22 Time: 0930 Location: [address] Dayroom  Group Topic: Stress Management  Goal Area(s) Addresses:  Patient will identify positive stress management techniques. Patient will identify benefits of using stress management post d/c.  Intervention: Stress Management  Activity:  Meditation.  LRT played a meditation that focused on deep concentration.  Patients were to listen to the meditation and follow along as it played to focus on breathing and concentrate as much as possible.    Education:  Stress Management, Discharge Planning.   Education Outcome: Acknowledges Education  Clinical Observations/Feedback:  Pt did not attend group.    Caroll Rancher, LRT/CTRS         Lillia Abed, Brittish Bolinger A 06/26/2020 11:06 AM

## 2020-06-26 NOTE — Tx Team (Signed)
Interdisciplinary Treatment and Diagnostic Plan Update  06/26/2020 Time of Session: 9:20am Riley [name] MRN: [id number]  Principal Diagnosis: MDD (major depressive disorder)  Secondary Diagnoses: Principal Problem:   MDD (major depressive disorder) Active Problems:   Psychosis ([place])   MDD (major depressive disorder), recurrent episode, severe ([place])   Current Medications:  Current Facility-Administered Medications  Medication Dose Route Frequency Provider Last Rate Last Admin  . acetaminophen (TYLENOL) tablet 650 mg  650 mg Oral Q6H PRN Sharma Covert, MD      . alum & mag hydroxide-simeth (MAALOX/MYLANTA) 200-200-20 MG/5ML suspension 30 mL  30 mL Oral Q4H PRN Sharma Covert, MD      . benztropine (COGENTIN) tablet 0.5 mg  0.5 mg Oral Q6H PRN Arthor Captain, MD      . diphenhydrAMINE (BENADRYL) injection 25 mg  25 mg Intramuscular Q6H PRN Arthor Captain, MD      . hydrOXYzine (ATARAX/VISTARIL) tablet 25 mg  25 mg Oral TID PRN Sharma Covert, MD      . magnesium hydroxide (MILK OF MAGNESIA) suspension 30 mL  30 mL Oral Daily PRN Sharma Covert, MD      . risperiDONE (RISPERDAL) tablet 1 mg  1 mg Oral Daily Arthor Captain, MD      . risperiDONE (RISPERDAL) tablet 3 mg  3 mg Oral QHS Arthor Captain, MD      . sertraline (ZOLOFT) tablet 25 mg  25 mg Oral Once Arthor Captain, MD      . Derrill Memo ON 06/27/2020] sertraline (ZOLOFT) tablet 50 mg  50 mg Oral Daily Arthor Captain, MD      . traZODone (DESYREL) tablet 50 mg  50 mg Oral QHS PRN Sharma Covert, MD       PTA Medications: Medications Prior to Admission  Medication Sig Dispense Refill Last Dose  . risperiDONE (RISPERDAL M-TABS) 2 MG disintegrating tablet Take 1 tablet (2 mg total) by mouth at bedtime. 30 tablet 0     Patient Stressors:    Patient Strengths:    Treatment Modalities: Medication Management, Group therapy, Case management,  1 to 1 session with clinician, Psychoeducation, Recreational  therapy.   Physician Treatment Plan for Primary Diagnosis: MDD (major depressive disorder) Long Term Goal(s):     Short Term Goals:    Medication Management: Evaluate patient's response, side effects, and tolerance of medication regimen.  Therapeutic Interventions: 1 to 1 sessions, Unit Group sessions and Medication administration.  Evaluation of Outcomes: Not Met  Physician Treatment Plan for Secondary Diagnosis: Principal Problem:   MDD (major depressive disorder) Active Problems:   Psychosis ([place])   MDD (major depressive disorder), recurrent episode, severe ([place])  Long Term Goal(s):     Short Term Goals:       Medication Management: Evaluate patient's response, side effects, and tolerance of medication regimen.  Therapeutic Interventions: 1 to 1 sessions, Unit Group sessions and Medication administration.  Evaluation of Outcomes: Not Met   RN Treatment Plan for Primary Diagnosis: MDD (major depressive disorder) Long Term Goal(s): Knowledge of disease and therapeutic regimen to maintain health will improve  Short Term Goals: Ability to demonstrate self-control, Ability to participate in decision making will improve and Ability to verbalize feelings will improve  Medication Management: RN will administer medications as ordered by provider, will assess and evaluate patient's response and provide education to patient for prescribed medication. RN will report any adverse and/or side effects to prescribing provider.  Therapeutic Interventions: 1  on 1 counseling sessions, Psychoeducation, Medication administration, Evaluate responses to treatment, Monitor vital signs and CBGs as ordered, Perform/monitor CIWA, COWS, AIMS and Fall Risk screenings as ordered, Perform wound care treatments as ordered.  Evaluation of Outcomes: Not Met   LCSW Treatment Plan for Primary Diagnosis: MDD (major depressive disorder) Long Term Goal(s): Safe transition to appropriate next level of care at  discharge, Engage patient in therapeutic group addressing interpersonal concerns.  Short Term Goals: Engage patient in aftercare planning with referrals and resources, Increase social support and Increase ability to appropriately verbalize feelings  Therapeutic Interventions: Assess for all discharge needs, 1 to 1 time with Social worker, Explore available resources and support systems, Assess for adequacy in community support network, Educate family and significant other(s) on suicide prevention, Complete Psychosocial Assessment, Interpersonal group therapy.  Evaluation of Outcomes: Not Met   Progress in Treatment: Attending groups: No. Participating in groups: No. Taking medication as prescribed: Yes. Toleration medication: Yes. Family/Significant other contact made: No, will contact:  CSW will obtain consent Patient understands diagnosis: Yes. Discussing patient identified problems/goals with staff: No. Medical problems stabilized or resolved: Yes. Denies suicidal/homicidal ideation: Yes. Issues/concerns per patient self-inventory: No. Other: None  New problem(s) identified: No, Describe:  None  New Short Term/Long Term Goal(s):medication stabilization, elimination of SI thoughts, development of comprehensive mental wellness plan.  Patient Goals:  Pt did not attend due to sleeping.  Discharge Plan or Barriers: Patient recently admitted. CSW will continue to follow and assess for appropriate referrals and possible discharge planning.  Reason for Continuation of Hospitalization: Hallucinations Medication stabilization Suicidal ideation  Estimated Length of Stay: 3-5 days  Attendees: Patient: 06/26/2020   Physician: Ethelene Browns, MD 06/26/2020   Nursing:  06/26/2020   RN Care Manager: 06/26/2020   Social Worker: Toney Reil, [place] 06/26/2020   Recreational Therapist:  06/26/2020   Other:  06/26/2020   Other:  06/26/2020   Other: 06/26/2020       Scribe for Treatment Team: Mliss Fritz, [place] 06/26/2020 11:30 AM

## 2020-06-26 NOTE — Progress Notes (Signed)
   06/26/20 0622  Vital Signs  Pulse Rate (!) 54  BP 131/83  BP Location Left Arm  BP Method Automatic  Patient Position (if appropriate) Standing  Oxygen Therapy  SpO2 100 %   D:  Patient denies SI/HI. Patient reported  hearing voices that  Were telling him to hurt himself and that he "can't hide". Pt. Reported anxiety 4/10 and depression 6/10. Pt. Denied pain.  A:  Patient took scheduled medicine. Pt. Denied PRN medicine for anxiety. Support and encouragement provided Routine safety checks conducted every 15 minutes. Patient  Informed to notify staff with any concerns.   R: Safety maintained.

## 2020-06-26 NOTE — Progress Notes (Signed)
Pt stated he was doing better today. Pt stayed in his room much of the evening. Pt continued to endorse AH . Pt given PRN Trazodone and Vistaril per MAR with HS medication    06/26/20 2200  Psych Admission Type (Psych Patients Only)  Admission Status Voluntary  Psychosocial Assessment  Patient Complaints Anxiety  Eye Contact Fair  Facial Expression Flat;Pensive  Affect Anxious  Speech Logical/coherent  Interaction Assertive  Motor Activity Other (Comment) (WDL)  Appearance/Hygiene Poor hygiene  Behavior Characteristics Cooperative  Mood Suspicious  Thought Process  Coherency WDL  Content WDL  Delusions Paranoid  Perception Hallucinations  Hallucination Auditory  Judgment Impaired  Confusion None  Danger to Self  Current suicidal ideation? Denies  Danger to Others  Danger to Others None reported or observed
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
## 2020-06-26 NOTE — Progress Notes (Signed)
D: Patient presents with flat affect at time of assessment. Patient reports some mild anxiety. Patient denies SI/HI at this time. Patient also denies VH but reports AH of negative voices but denies them being command in nature. Patient reports that the voices have improved some. Patient contracts for safety.  A: Provided positive reinforcement and encouragement.  R: Patient cooperative and receptive to efforts. Patient remains safe on the unit.   06/25/20 2144  Psych Admission Type (Psych Patients Only)  Admission Status Voluntary  Psychosocial Assessment  Patient Complaints Anxiety;Sadness  Eye Contact Fair  Facial Expression Flat;Pensive  Affect Anxious  Speech Logical/coherent  Interaction Assertive  Motor Activity Other (Comment) (WDL)  Appearance/Hygiene Poor hygiene  Behavior Characteristics Cooperative;Appropriate to situation  Mood Depressed  Thought Process  Coherency WDL  Content WDL  Delusions Paranoid  Perception Hallucinations  Hallucination Auditory  Judgment Impaired  Confusion None  Danger to Self  Current suicidal ideation? Denies  Danger to Others  Danger to Others None reported or observed

## 2020-06-26 NOTE — BHH Suicide Risk Assessment (Signed)
[facility] Admission Suicide Risk Assessment   Nursing information obtained from:  Patient Demographic factors:  Caucasian,Male,Living alone Current Mental Status:  NA Loss Factors:  NA Historical Factors:  NA Family history of suicide in father and paternal uncle Risk Reduction Factors:  Positive social support  Total Time spent with patient: 50 minutes Principal Problem: Major depressive disorder, recurrent, severe with psychotic features ([place]) Diagnosis:  Principal Problem:   Major depressive disorder, recurrent, severe with psychotic features ([place]) Active Problems:   MDD (major depressive disorder)   Psychosis ([place])  Subjective Data:  Medical record reviewed.  Patient's case discussed in detail with members of the treatment team.  I met with and evaluated the patient in the office on the unit today. Riley [name] is a 25 year old male with a history of recurrent major depressive disorder with psychotic features as well as a history of past marijuana use and recent alcohol use who presented voluntarily to [facility] as a walk-in alone reporting a chief complaint of "I am hearing voices."  The patient was previously admitted to [facility] with similar symptoms in June 2021 and was diagnosed with major depressive disorder with psychotic features and discharged on a combination of risperidone and sertraline.  Patient states that he did well on these medications and then his mother died in February 22, 2020 and he started to become more depressed.  The patient stopped taking his medications in 04-24-20 and sometime in April he started to hear voices again.  The patient resumed taking risperidone 2 mg at bedtime at the end of April but states that thus far it has not helped reduce the voices.  Patient reports that he has missed some risperidone doses since he restarted his medications.  Patient experiences auditory hallucinations of voices of family members and others making statements that they want to kill the patient  and that he cannot hide.  They also tell patient to kill himself.  States that the voices talk about patient and comment on what he does.  He at times also feels like he can read the thoughts of others.  Patient has paranoia that people are after him and trying to kill him.  This paranoia has resulted in difficulty sleeping and he has at times spent the night driving in his car due to feeling anxious, paranoid and unsafe.  Reports symptoms of depressed mood, decreased appetite, anxiety, anhedonia, low energy, decreased concentration, guilt, sleep disturbance (mostly insomnia) and feeling slowed down.  He denies that he wants to die or wants to her kill himself.  He denies feeling compelled to act on statements made by the voices he hears.  The patient denies VH, AI, HI.  He denies any recent drug use.  He had been drinking alcohol following the death of his mother in 2019-04-25 and this increased to between 6 and 12 beers a day.  When he resumed taking his risperidone about 10 days ago he significantly decreased his alcohol use and has been drinking 1-2 beers approximately every other day.  The patient denies any symptoms of alcohol withdrawal.  He denies other drug use since he was discharged from [facility] in June 2021.  The patient reports prior diagnosis of major depressive disorder with psychotic features.  He has a history of 1 prior psychiatric inpatient admission from 07/25/2019 until 07/29/2019 at [facility] for depression and auditory hallucinations as described above.  The patient denies other medication trials.  [name] at the mood treatment center in [place] prescribes his medications.  The patient states that he has had intermittent depression since approximately the age of 25 years old when his mother started to date a man that patient thought was not very nice.  The patient has a history of 1 near suicide attempt at age 89 when he was depressed.  At that time he held a gun to his head but did not follow  through and did not pull the trigger.  The patient did not receive any treatment for his mental health issues until June 2021 when he was hospitalized at [facility].  He denies any history of past periods meeting criteria for hypomanic or manic episodes.  Reports a family history of suicide in his father and a paternal uncle.  Patient's states that his father committed suicide when the patient was 25 years old.  The patient's father had symptoms of depression.  Medical records reviewed indicate that patient's father may also have had a substance use issue.  The patient denies any other known family psychiatric history.  He denies any history of physical or sexual abuse.  Patient reports that he had a concussion at age 24 while snowboarding.  He denies any other medical history including any history of surgeries or seizure.  Patient presents as depressed during our conversation.  He appears tired with constricted affect.  He expresses desire to restart sertraline and is willing to continue risperidone and increase the dose to target his hallucinations.  He reports that he has access to firearms but he gave them to his grandmother to secure for him and they are now stored at his grandmother's house.  Continued Clinical Symptoms:    The "Alcohol Use Disorders Identification Test", Guidelines for Use in Primary Care, Second Edition.  World Pharmacologist [facility]). Score between 0-7:  no or low risk or alcohol related problems. Score between 8-15:  moderate risk of alcohol related problems. Score between 16-19:  high risk of alcohol related problems. Score 20 or above:  warrants further diagnostic evaluation for alcohol dependence and treatment.   CLINICAL FACTORS:   Severe Anxiety and/or Agitation Depression:   Anhedonia Comorbid alcohol abuse/dependence Insomnia Paranoid Auditory hallucinations Currently Psychotic Previous Psychiatric Diagnoses and Treatments   Musculoskeletal: Strength & Muscle  Tone: within normal limits Gait & Station: normal Patient leans: N/A  Psychiatric Specialty Exam:  Presentation  General Appearance: Disheveled; Casual  Eye Contact:Fair  Speech:Clear and Coherent; Normal Rate  Speech Volume:Normal  Handedness:Right   Mood and Affect  Mood:Depressed  Affect:Constricted   Thought Process  Thought Processes:Coherent; Goal Directed  Descriptions of Associations:Intact  Orientation:Full (Time, Place and Person)  Thought Content:Paranoid Ideation  History of Schizophrenia/Schizoaffective disorder:No  Duration of Psychotic Symptoms:Less than six months  Hallucinations:Hallucinations: Auditory Description of Auditory Hallucinations: voices telling pt to kill himself.  States he does not want/does not have to act on voices  Ideas of Reference:Paranoia  Suicidal Thoughts:Suicidal Thoughts: No (denies desire to die or to kill self but reports AH telling him to kill self) SI Passive Intent and/or Plan: Without Intent; Without Plan; With Access to Means  Homicidal Thoughts:Homicidal Thoughts: No   Sensorium  Memory:Immediate Good; Recent Good; Remote Good  Judgment:Fair  Insight:Fair   Executive Functions  Concentration:Fair  Attention Span:Fair  Recall:Good  Fund of Knowledge:Good  Language:Good   Psychomotor Activity  Psychomotor Activity:Psychomotor Activity: Normal   Assets  Assets:Communication Skills; Desire for Improvement; Housing; Physical Health; Resilience; Social Support   Sleep  Sleep:Sleep: Poor Number of Hours of Sleep: 0  Physical Exam: Physical Exam Vitals and nursing note reviewed.  HENT:     Head: Normocephalic and atraumatic.  Neurological:     General: No focal deficit present.     Mental Status: He is alert and oriented to person, place, and time.  Psychiatric:        Attention and Perception: He perceives auditory hallucinations. He does not perceive visual hallucinations.         Mood and Affect: Mood is anxious and depressed. Affect is flat.        Speech: Speech normal.        Behavior: Behavior normal. Behavior is cooperative.        Thought Content: Thought content is paranoid.        Cognition and Memory: Cognition and memory normal.    Review of Systems  Constitutional: Negative for chills, diaphoresis and fever.  HENT: Negative for hearing loss and sore throat.   Eyes: Negative for blurred vision.  Respiratory: Negative for cough and shortness of breath.   Cardiovascular: Negative for chest pain and palpitations.  Gastrointestinal: Negative for constipation, diarrhea, nausea and vomiting.  Genitourinary: Negative for dysuria.  Musculoskeletal: Negative.   Skin: Negative for rash.  Neurological: Negative for dizziness, tremors, seizures and headaches.  Psychiatric/Behavioral: Positive for depression and hallucinations. The patient is nervous/anxious and has insomnia.        Positive for command auditory hallucinations telling patient he should kill himself.  Patient states he does not want to act on voices and does not have to act on the voices.   Blood pressure 131/83, pulse (!) 54, temperature 97.7 F (36.5 C), temperature source Oral, resp. rate 16, height _0  (1.854 m), weight 88 kg, SpO2 100 %. Body mass index is 25.6 kg/m.   COGNITIVE FEATURES THAT CONTRIBUTE TO RISK:  None    SUICIDE RISK:   Moderate:  Frequent suicidal ideation with limited intensity, and duration, some specificity in terms of plans, no associated intent, good self-control, limited dysphoria/symptomatology, some risk factors present, and identifiable protective factors, including available and accessible social support.  PLAN OF CARE:  Patient's current symptoms and presentation are consistent with a diagnosis of major depressive disorder recurrent, current episode severe with psychotic features.  Will place on every 15-minute observation status.  Encouraged participation in  group therapy and therapeutic milieu.  Will increase risperidone to 1 mg every morning and 3 mg nightly to target auditory hallucinations and paranoia.  Sertraline has been resumed at 25 mg daily and will increase it to 50 mg daily for depression.  Hydroxyzine 25 mg 3 times daily PRN anxiety.  Trazodone 50 mg nightly PRN insomnia.  See MAR for additional details.  Available lab results from a.m. draw on 06/26/2020 include CMP which was WNL.  Lipid panel revealed LDL of 123, HDL of 39 and otherwise WNL.  CBC was WNL.  Hemoglobin A1c was 5.0.  TSH was 0.992.  Urine drug screen was negative.  EKG revealed sinus bradycardia with a ventricular rate of 50 and QT/QTc of 406/370 with early repolarization.  Estimated length of stay 3 to 5 days.  I certify that inpatient services furnished can reasonably be expected to improve the patient's condition.   Arthor Captain, MD 06/26/2020, 12:49 PM

## 2020-06-26 NOTE — B[facility] Group Notes (Signed)
LCSW Aftercare Discharge Planning Group Note  06/26/2020  Type of Group and Topic: Psychoeducational Group: Discharge Planning  Participation Level: Active  Description of Group  Discharge planning group reviews patient's anticipated discharge plans and assists patients to anticipate and address any barriers to wellness/recovery in the community. Suicide prevention education is reviewed with patients in group.  Therapeutic Goals  1. Patients will state their anticipated discharge plan and mental health aftercare 2. Patients will identify potential barriers to wellness in the community setting 3. Patients will engage in problem solving, solution focused discussion of ways to anticipate and address barriers to wellness/recovery  Summary of Patient Progress: Pt was attentive during group but did not share unless prompted.    Therapeutic Modalities: Motivational Interviewing  Fredirick Lathe, LCSWA Clinicial Social Worker [employer]

## 2020-06-27 NOTE — Progress Notes (Signed)
Pt visible on the unit some. Pt stated the AH are getting better. Pt spent much of the evening in his room. Pt  given PRN Trazodone and Vistaril per MAR with HS medication.     06/27/20 2200  Psych Admission Type (Psych Patients Only)  Admission Status Voluntary  Psychosocial Assessment  Patient Complaints Anxiety;Suspiciousness  Eye Contact Fair  Facial Expression Flat;Pensive  Affect Anxious  Speech Logical/coherent  Interaction Assertive  Motor Activity Other (Comment) (WDL)  Appearance/Hygiene Unremarkable  Behavior Characteristics Cooperative;Guarded  Mood Anxious;Pleasant  Thought Process  Coherency WDL  Content WDL  Delusions WDL  Perception Hallucinations  Hallucination Auditory  Judgment Impaired  Confusion None  Danger to Self  Current suicidal ideation? Denies  Danger to Others  Danger to Others None reported or observed

## 2020-06-27 NOTE — [facility] Group Notes (Signed)
.  Psychoeducational Group Note  Date: 06/27/2020 Time: 0900-1000    Goal Setting   Purpose of Group: This group helps to provide patients with the steps of setting [name] goal that is specific, measurable, attainable, realistic and time specific. [name] discussion on how we keep ourselves stuck with negative self talk.    Participation Level:  Did not attend   Riley [name]  

## 2020-06-27 NOTE — Plan of Care (Signed)
  Problem: Coping: Goal: Coping ability will improve Outcome: Progressing   Problem: Safety: Goal: Ability to redirect hostility and anger into socially appropriate behaviors will improve Outcome: Progressing   Problem: Education: Goal: Knowledge of [facility] Education information/materials will improve Outcome: Progressing

## 2020-06-27 NOTE — B[facility] Group Notes (Signed)
Adult Psychoeducational Group Note  Date:  06/27/2020 Time:  6:06 PM  Group Topic/Focus:  Goals Group:   The focus of this group is to help patients establish daily goals to achieve during treatment and discuss how the patient can incorporate goal setting into their daily lives to aide in recovery.  Participation Level:  Did Not Attend  Deforest [employer] [facility] 06/27/2020, 6:06 PM

## 2020-06-27 NOTE — Progress Notes (Signed)
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  Objective: Patient is seen and examined.  Patient is a 25 year old male with the above-stated past psychiatric history who is seen in follow-up.  The patient was admitted yesterday by Dr. Fayrene Fearing.  He has a history of major depression with psychotic features.  On admission the patient stated that he had stopped taking Risperdal and sertraline which had been prescribed previously.  He stated that he had resumed taking Risperdal 2 mg at bedtime at the end of April, but the voices had continued.  He stated that the auditory hallucinations were saying that they wanted to kill him.  He also had paranoid delusions that people were after him.  He was started on Cogentin 0.5 mg p.o. every 6 hours as needed, Risperdal 1 mg p.o. daily and 3 mg p.o. nightly, and sertraline was restarted at 50 mg p.o. daily.  The patient stated that the auditory hallucinations have decreased.  He stated they have decreased to the degree that he is unable to hear them.  He is quite disheveled today, and slept well last night.  We did discuss the fact that he had not slept for for 5 days prior to admission.  He still has some degree of paranoid delusions, but denied any suicidal ideation this morning.  His vital signs are stable, he is afebrile.  Pulse oximetry on room air was 100%.  Review of his admission laboratories showed normal electrolytes including creatinine and liver function enzymes.  Lipid panel was normal.  CBC was normal.  Hemoglobin A1c was 5.0.  TSH was 0.992.  Respiratory panel was negative for influenza A, B and coronavirus.  Blood alcohol was not  obtained.  Drug screen was completely negative.  EKG showed a sinus bradycardia and normal QTc interval.  Principal Problem: Major depressive disorder, recurrent, severe with psychotic features (HCC) Diagnosis: Principal Problem:   Major depressive disorder, recurrent, severe with psychotic features (HCC) Active Problems:   MDD (major depressive disorder)   Psychosis (HCC)  Total Time spent with patient: 20 minutes  Past Psychiatric History: See admission H&P  Past Medical History: History reviewed. No pertinent past medical history. History reviewed. No pertinent surgical history. Family History: History reviewed. No pertinent family history. Family Psychiatric  History: See admission H&P Social History:  Social History   Substance and Sexual Activity  Alcohol Use Yes   Comment: Occasional 1-2 beers     Social History   Substance and Sexual Activity  Drug Use Yes  . Types: Marijuana   Comment: occasional THC Use    Social History   Socioeconomic History  . Marital status: Single    Spouse name: Not on file  . Number of children: Not on file  . Years of education: Not on file  . Highest education level: Not on file  Occupational History  . Not on file  Tobacco Use  . Smoking status: Current Every Day Smoker    Packs/day: 0.50    Types: Cigarettes  . Smokeless tobacco: Never Used  Substance and Sexual Activity  . Alcohol use: Yes    Comment: Occasional 1-2 beers  . Drug use: Yes    Types: Marijuana  Comment: occasional THC Use  . Sexual activity: Not on file  Other Topics Concern  . Not on file  Social History Narrative  . Not on file   Social Determinants of Health   Financial Resource Strain: Not on file  Food Insecurity: Not on file  Transportation Needs: Not on file  Physical Activity: Not on file  Stress: Not on file  Social Connections: Not on file   Additional Social History:    Pain Medications: see MAR Prescriptions: see MAR Over the  Counter: see MAR History of alcohol / drug use?: Yes Longest period of sobriety (when/how long): states that he has been slowing down on his use of alcohol and marijuana over the past three years Name of Substance 1: Alcohol 1 - Age of First Use: unknown 1 - Amount (size/oz): 6 pack -12 pack of beer 1 - Frequency: daily 1 - Duration: on-gong 1 - Last Use / Amount: "I stopped drinking Apirl 2022 after the voices started coming back" 1 - Method of Aquiring: local stores 1- Route of Use: oral Name of Substance 2: THC 2 - Age of First Use: unknown 2 - Amount (size/oz): varied 2 - Frequency: occasional use 2 - Duration: on-going 2 - Last Use / Amount: "O s 2 - Method of Aquiring: "I stopped drinking Apirl 2022 after the voices started coming back" 2 - Route of Substance Use: inhalation; smoking                Sleep: Good  Appetite:  Fair  Current Medications: Current Facility-Administered Medications  Medication Dose Route Frequency Provider Last Rate Last Admin  . acetaminophen (TYLENOL) tablet 650 mg  650 mg Oral Q6H PRN Antonieta Pert, MD      . alum & mag hydroxide-simeth (MAALOX/MYLANTA) 200-200-20 MG/5ML suspension 30 mL  30 mL Oral Q4H PRN Antonieta Pert, MD      . benztropine (COGENTIN) tablet 0.5 mg  0.5 mg Oral Q6H PRN Claudie Revering, MD      . diphenhydrAMINE (BENADRYL) injection 25 mg  25 mg Intramuscular Q6H PRN Claudie Revering, MD      . hydrOXYzine (ATARAX/VISTARIL) tablet 25 mg  25 mg Oral TID PRN Antonieta Pert, MD   25 mg at 06/26/20 2115  . magnesium hydroxide (MILK OF MAGNESIA) suspension 30 mL  30 mL Oral Daily PRN Antonieta Pert, MD      . risperiDONE (RISPERDAL) tablet 1 mg  1 mg Oral Daily Claudie Revering, MD   1 mg at 06/27/20 0857  . risperiDONE (RISPERDAL) tablet 3 mg  3 mg Oral QHS Claudie Revering, MD   3 mg at 06/26/20 2115  . sertraline (ZOLOFT) tablet 50 mg  50 mg Oral Daily Claudie Revering, MD   50 mg at 06/27/20 0857  . traZODone  (DESYREL) tablet 50 mg  50 mg Oral QHS PRN Antonieta Pert, MD   50 mg at 06/26/20 2115    Lab Results:  Results for orders placed or performed during the hospital encounter of 06/25/20 (from the past 48 hour(s))  Resp Panel by RT-PCR (Flu A&B, Covid) Nasopharyngeal Swab     Status: None   Collection Time: 06/25/20  1:47 PM   Specimen: Nasopharyngeal Swab; Nasopharyngeal(NP) swabs in vial transport medium  Result Value Ref Range   SARS Coronavirus 2 by RT PCR NEGATIVE NEGATIVE    Comment: (NOTE) SARS-CoV-2 target nucleic acids are NOT DETECTED.  The SARS-CoV-2 RNA is  generally detectable in upper respiratory specimens during the acute phase of infection. The lowest concentration of SARS-CoV-2 viral copies this assay can detect is 138 copies/mL. A negative result does not preclude SARS-Cov-2 infection and should not be used as the sole basis for treatment or other patient management decisions. A negative result may occur with  improper specimen collection/handling, submission of specimen other than nasopharyngeal swab, presence of viral mutation(s) within the areas targeted by this assay, and inadequate number of viral copies(<138 copies/mL). A negative result must be combined with clinical observations, patient history, and epidemiological information. The expected result is Negative.  Fact Sheet for Patients:  BloggerCourse.com  Fact Sheet for Healthcare Providers:  SeriousBroker.it  This test is no t yet approved or cleared by the [place] FDA and  has been authorized for detection and/or diagnosis of SARS-CoV-2 by FDA under an Emergency Use Authorization (EUA). This EUA will remain  in effect (meaning this test can be used) for the duration of the COVID-19 declaration under Section 564(b)(1) of the Act, 21 U.S.C.section 360bbb-3(b)(1), unless the authorization is terminated  or revoked sooner.       Influenza A by  PCR NEGATIVE NEGATIVE   Influenza B by PCR NEGATIVE NEGATIVE    Comment: (NOTE) The Xpert Xpress SARS-CoV-2/FLU/RSV plus assay is intended as an aid in the diagnosis of influenza from Nasopharyngeal swab specimens and should not be used as a sole basis for treatment. Nasal washings and aspirates are unacceptable for Xpert Xpress SARS-CoV-2/FLU/RSV testing.  Fact Sheet for Patients: BloggerCourse.com  Fact Sheet for Healthcare Providers: SeriousBroker.it  This test is not yet approved or cleared by the [place] FDA and has been authorized for detection and/or diagnosis of SARS-CoV-2 by FDA under an Emergency Use Authorization (EUA). This EUA will remain in effect (meaning this test can be used) for the duration of the COVID-19 declaration under Section 564(b)(1) of the Act, 21 U.S.C. section 360bbb-3(b)(1), unless the authorization is terminated or revoked.  Performed at [facility], [address], [place], [place] [address]   Urine rapid drug screen (hosp performed)not at [facility]     Status: None   Collection Time: 06/26/20  6:17 AM  Result Value Ref Range   Opiates NONE DETECTED NONE DETECTED   Cocaine NONE DETECTED NONE DETECTED   Benzodiazepines NONE DETECTED NONE DETECTED   Amphetamines NONE DETECTED NONE DETECTED   Tetrahydrocannabinol NONE DETECTED NONE DETECTED   Barbiturates NONE DETECTED NONE DETECTED    Comment: (NOTE) DRUG SCREEN FOR MEDICAL PURPOSES ONLY.  IF CONFIRMATION IS NEEDED FOR ANY PURPOSE, NOTIFY LAB WITHIN 5 DAYS.  LOWEST DETECTABLE LIMITS FOR URINE DRUG SCREEN Drug Class                     Cutoff (ng/mL) Amphetamine and metabolites    1000 Barbiturate and metabolites    200 Benzodiazepine                 200 Tricyclics and metabolites     300 Opiates and metabolites        300 Cocaine and metabolites        300 THC                            50 Performed at [facility], [address] [place], [place] [address]   Hemoglobin A1c     Status: None   Collection Time:  06/26/20  6:29 AM  Result Value Ref Range   Hgb A1c MFr Bld 5.0 4.8 - 5.6 %    Comment: (NOTE) Pre diabetes:          5.7%-6.4%  Diabetes:              >6.4%  Glycemic control for   <7.0% adults with diabetes    Mean Plasma Glucose 96.8 mg/dL    Comment: Performed at [facility], [address], [place], [place] [address]  CBC     Status: None   Collection Time: 06/26/20  6:29 AM  Result Value Ref Range   WBC 7.0 4.0 - 10.5 K/uL   RBC 5.48 4.22 - 5.81 MIL/uL   Hemoglobin 16.3 13.0 - 17.0 g/dL   HCT 60.7 37.1 - 06.2 %   MCV 90.9 80.0 - 100.0 fL   MCH 29.7 26.0 - 34.0 pg   MCHC 32.7 30.0 - 36.0 g/dL   RDW 69.4 85.4 - 62.7 %   Platelets 184 150 - 400 K/uL   nRBC 0.0 0.0 - 0.2 %    Comment: Performed at [facility], [address] [place], [place] [address]  Lipid panel     Status: Abnormal   Collection Time: 06/26/20  6:29 AM  Result Value Ref Range   Cholesterol 187 0 - 200 mg/dL   Triglycerides 938 <182 mg/dL   HDL 39 (L) >99 mg/dL   Total CHOL/HDL Ratio 4.8 RATIO   VLDL 25 0 - 40 mg/dL   LDL Cholesterol 371 (H) 0 - 99 mg/dL    Comment:        Total Cholesterol/HDL:CHD Risk Coronary Heart Disease Risk Table                     Men   Women  1/2 Average Risk   3.4   3.3  Average Risk       5.0   4.4  2 X Average Risk   9.6   7.1  3 X Average Risk  23.4   11.0        Use the calculated Patient Ratio above and the CHD Risk Table to determine the patient's CHD Risk.        ATP III CLASSIFICATION (LDL):  <100     mg/dL   Optimal  696-789  mg/dL   Near or Above                    Optimal  130-159  mg/dL   Borderline  381-017  mg/dL   High  >510     mg/dL   Very High Performed at [facility], [address] [place], [place] [address]   TSH     Status: None   Collection Time: 06/26/20  6:29 AM   Result Value Ref Range   TSH 0.992 0.350 - 4.500 uIU/mL    Comment: Performed by a 3rd Generation assay with a functional sensitivity of <=0.01 uIU/mL. Performed at [facility], [address], [place], [place] [address]   Comprehensive metabolic panel     Status: None   Collection Time: 06/26/20  6:29 AM  Result Value Ref Range   Sodium 141 135 - 145 mmol/L   Potassium 4.0 3.5 - 5.1 mmol/L   Chloride 107 98 - 111 mmol/L   CO2 23 22 - 32 mmol/L   Glucose, Bld 83 70 - 99 mg/dL  Comment: Glucose reference range applies only to samples taken after fasting for at least 8 hours.   BUN 12 6 - 20 mg/dL   Creatinine, Ser 2.54 0.61 - 1.24 mg/dL   Calcium 9.5 8.9 - 27.0 mg/dL   Total Protein 7.9 6.5 - 8.1 g/dL   Albumin 4.6 3.5 - 5.0 g/dL   AST 17 15 - 41 U/L   ALT 23 0 - 44 U/L   Alkaline Phosphatase 67 38 - 126 U/L   Total Bilirubin 0.5 0.3 - 1.2 mg/dL   GFR, Estimated >62 >37 mL/min    Comment: (NOTE) Calculated using the CKD-EPI Creatinine Equation (2021)    Anion gap 11 5 - 15    Comment: Performed at [facility], [address] [place], [place] [address]    Blood Alcohol level:  Lab Results  Component Value Date   ETH <10 07/25/2019    Metabolic Disorder Labs: Lab Results  Component Value Date   HGBA1C 5.0 06/26/2020   MPG 96.8 06/26/2020   MPG 102.54 07/25/2019   Lab Results  Component Value Date   PROLACTIN 68.0 (H) 07/25/2019   Lab Results  Component Value Date   CHOL 187 06/26/2020   TRIG 127 06/26/2020   HDL 39 (L) 06/26/2020   CHOLHDL 4.8 06/26/2020   VLDL 25 06/26/2020   LDLCALC 123 (H) 06/26/2020   LDLCALC 96 07/25/2019    Physical Findings: AIMS:  , ,  ,  ,    CIWA:    COWS:     Musculoskeletal: Strength & Muscle Tone: within normal limits Gait & Station: normal Patient leans: N/A  Psychiatric Specialty Exam:  Presentation  General Appearance: Disheveled  Eye Contact:Fair  Speech:Normal  Rate  Speech Volume:Decreased  Handedness:Right   Mood and Affect  Mood:Dysphoric  Affect:Flat   Thought Process  Thought Processes:Goal Directed  Descriptions of Associations:Loose  Orientation:Full (Time, Place and Person)  Thought Content:Delusions; Paranoid Ideation  History of Schizophrenia/Schizoaffective disorder:Yes  Duration of Psychotic Symptoms:Greater than six months  Hallucinations:Hallucinations: Auditory Description of Auditory Hallucinations: voices telling pt to kill himself.  States he does not want/does not have to act on voices  Ideas of Reference:Delusions; Paranoia  Suicidal Thoughts:Suicidal Thoughts: No  Homicidal Thoughts:Homicidal Thoughts: No   Sensorium  Memory:Immediate Fair; Recent Fair; Remote Fair  Judgment:Fair  Insight:Fair   Executive Functions  Concentration:Fair  Attention Span:Fair  Recall:Fair  Fund of Knowledge:Fair  Language:Fair   Psychomotor Activity  Psychomotor Activity:Psychomotor Activity: Decreased   Assets  Assets:Desire for Improvement; Resilience   Sleep  Sleep:Sleep: Good Number of Hours of Sleep: 8    Physical Exam: Physical Exam Vitals and nursing note reviewed.  HENT:     Head: Normocephalic and atraumatic.  Pulmonary:     Effort: Pulmonary effort is normal.  Neurological:     General: No focal deficit present.     Mental Status: He is alert and oriented to person, place, and time.    ROS Blood pressure 123/74, pulse 77, temperature (!) 97.5 F (36.4 C), temperature source Oral, resp. rate 16, height 6\' 1"  (1.854 m), weight 88 kg, SpO2 100 %. Body mass index is 25.6 kg/m.   Treatment Plan Summary: Daily contact with patient to assess and evaluate symptoms and progress in treatment, Medication management and Plan : Patient is seen and examined.  Patient is a 25 year old male with the above-stated past psychiatric history who is seen in follow-up.   Diagnosis: 1.  Major  depression, recurrent,  severe with psychotic features 2.  Sinus bradycardia  Pertinent findings on examination today: 1.  Decreased auditory hallucinations. 2.  Decreased paranoia. 3.  Mood is unchanged. 4.  Sleep is improved.  Plan: 1.  Continue Cogentin 0.5 mg p.o. every 6 hours as needed tremor. 2.  Continue hydroxyzine 25 mg p.o. 3 times daily as needed anxiety. 3.  Continue Risperdal 1 mg p.o. daily and 3 mg p.o. nightly for psychosis and mood stability. 4.  Continue sertraline 50 mg p.o. daily for anxiety and depression. 5.  Continue trazodone 50 mg p.o. nightly as needed insomnia. 6.  Disposition planning-in progress.  Antonieta PertGreg Lawson Imad Shostak, MD 06/27/2020, 1:31 PM

## 2020-06-27 NOTE — Progress Notes (Signed)
Pt denied SI/HI.  Stated that he hears voices, but cannot make out what the voices are telling him.  Pt is guarded, and he has not engaged much out of his room.  RN established rapport with pt and assessed for needs and concerns.  RN administered medications per provider orders. Pt appears to be in no acute distress at this time.  Pt remains safe on the unit with q 15 min checks in place.

## 2020-06-27 NOTE — Progress Notes (Signed)
   06/27/20 0500  Sleep  Number of Hours 8.25

## 2020-06-28 LAB — LIPID PANEL
Cholesterol: 175 mg/dL (ref 0–200)
HDL: 36 mg/dL — ABNORMAL LOW (ref >40)
LDL Cholesterol: 63 mg/dL (ref 0–99)
Total CHOL/HDL Ratio: 4.9 RATIO
Triglycerides: 380 mg/dL — ABNORMAL HIGH (ref <150)
VLDL: 76 mg/dL — ABNORMAL HIGH (ref 0–40)

## 2020-06-28 MED ORDER — SERTRALINE HCL 50 MG PO TABS
75.0000 mg | ORAL_TABLET | Freq: Every day | ORAL | Status: DC
  Administered 2020-06-29 – 2020-07-02 (×4): 75 mg via ORAL
  Filled 2020-06-28 (×6): qty 1

## 2020-06-28 MED ORDER — RISPERIDONE 0.5 MG PO TABS
0.5000 mg | ORAL_TABLET | Freq: Every day | ORAL | Status: DC
  Administered 2020-06-29 – 2020-07-02 (×4): 0.5 mg via ORAL
  Filled 2020-06-28: qty 1
  Filled 2020-06-28: qty 7
  Filled 2020-06-28 (×4): qty 1

## 2020-06-28 NOTE — [facility] Group Notes (Signed)
Adult Psychoeducational Group Not Date:  06/28/2020 Time:  0900-1045 Group Topic/Focus: PROGRESSIVE RELAXATION. A group where deep breathing is taught and tensing and relaxation muscle groups is used. Imagery is used as well.  Pts are asked to imagine 3 pillars that hold them up when they are not able to hold themselves up.  Participation Level:  Active  Participation Quality:  Appropriate  Affect:  Appropriate  Cognitive:  Oriented  Insight: Improving  Engagement in Group:  Engaged  Modes of Intervention:  Activity, Discussion, Education, and Support  Additional Comments:  Rates his energy at a 7. States his music, pets and family are what holds him up.  Dione Housekeeper

## 2020-06-28 NOTE — Progress Notes (Signed)
Adult Psychoeducational Group Note  Date:  06/28/2020 Time:  12:58 AM  Group Topic/Focus:  Wrap-Up Group:   The focus of this group is to help patients review their daily goal of treatment and discuss progress on daily workbooks.  Participation Level:  Minimal  Participation Quality:  Appropriate  Affect:  Anxious and Depressed  Cognitive:  Disorganized  Insight: Limited  Engagement in Group:  Limited  Modes of Intervention:  Discussion  Additional Comments:  Pt stated his goal for today was to focus on his treatment plan. Pt stated he accomplished his goal today. Pt stated he talk with his doctor but did not get a chance to talk with his social worker about his care today. Pt rated his overall day a 6 out of 10. Pt stated he made no calls today. Pt stated he felt better about himself today. Pt stated he  Did not get a chance to attend groups today. Pt stated he attend all meals today. Pt stated he took all medications provided today. Pt stated his appetite was good today. Pt stated his sleep last night was pretty good. Pt stated the goal tonight was to get some rest tonight. Pt stated he had no physical pain tonight.  Pt deny visual hallucinations and auditory issues tonight.  Pt denies thoughts of harming himself or others. Pt stated he would alert staff if anything changed  Riley [name] 06/28/2020, 12:58 AM

## 2020-06-28 NOTE — Plan of Care (Signed)
Nurse discussed anxiety, depression and coping skills with patient.  

## 2020-06-28 NOTE — Progress Notes (Signed)
   06/28/20 0500  Sleep  Number of Hours 8

## 2020-06-28 NOTE — Progress Notes (Addendum)
D:  Patient's self inventory sheet, patient has fair sleep, no sleep medication.  Good appetite, normal energy level, good concentration.  Rate depression 4, denied hopeless, anxiety 3.  Denied withdrawals.  Denied SI.  Denied physical problems.  Denied physical pain.  Goal is get up and out of bed.  Plans to express feelings better. A:  Medications administered per MD orders.  Emotional support and encouragement given patient. R:  Denied SI and HI, contracts for safety.  Denied A/V hallucinations.  Safety maintained with 15 minute checks.  This afternoon patient stated he was hearing "mumblings slightly, could not understand words".

## 2020-06-28 NOTE — Plan of Care (Signed)
  Problem: Education: Goal: Will be free of psychotic symptoms Outcome: Progressing   Problem: Coping: Goal: Coping ability will improve Outcome: Progressing   Problem: Role Relationship: Goal: Ability to communicate needs accurately will improve Outcome: Progressing   Problem: Role Relationship: Goal: Ability to interact with others will improve Outcome: Progressing

## 2020-06-28 NOTE — Progress Notes (Addendum)
Pt visible in the dayroom some this evening. Pt stated he felt a little better, pt continues to endorse AH at a reduced rate. Pt given PRN Trazodone and Vistaril per [facility]     06/28/20 2100  Psych Admission Type (Psych Patients Only)  Admission Status Voluntary  Psychosocial Assessment  Patient Complaints Anxiety  Eye Contact Fair  Facial Expression Flat;Pensive  Affect Anxious  Speech Logical/coherent  Interaction Assertive  Motor Activity Other (Comment) (WDL)  Appearance/Hygiene Unremarkable  Behavior Characteristics Anxious  Mood Anxious;Sad  Thought Process  Coherency WDL  Content WDL  Delusions WDL  Perception Hallucinations  Hallucination Auditory  Judgment Impaired  Confusion None  Danger to Self  Current suicidal ideation? Denies  Danger to Others  Danger to Others None reported or observed

## 2020-06-28 NOTE — Progress Notes (Signed)
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  Objective: Patient is seen and examined.  Patient is a 25 year old male with the above-stated past psychiatric history who is seen in follow-up.  He is about the same today as yesterday.  He stated that his auditory hallucinations were not really understandable but still present.  He still looks quite disheveled.  Some his paranoid delusions are still present but decreased from yesterday.  Review of nursing notes from last night showed that he had reported that his auditory hallucinations were getting better.  It was noted that the patient spent much of the evening in his room.  No new laboratories.  His vital signs are stable, he is afebrile.  Pulse oximetry on room air was 100%.  He slept 8 hours last night.  Principal Problem: Major depressive disorder, recurrent, severe with psychotic features (HCC) Diagnosis: Principal Problem:   Major depressive disorder, recurrent, severe with psychotic features (HCC) Active Problems:   MDD (major depressive disorder)   Psychosis (HCC)  Total Time spent with patient: 15 minutes  Past Psychiatric History: See admission H&P  Past Medical History: History reviewed. No pertinent past medical history. History reviewed. No pertinent surgical history. Family History: History reviewed. No pertinent family history. Family Psychiatric  History: See admission H&P Social History:  Social History   Substance and Sexual Activity  Alcohol Use Yes   Comment: Occasional 1-2 beers     Social History   Substance and Sexual Activity  Drug Use Yes  . Types: Marijuana   Comment: occasional  THC Use    Social History   Socioeconomic History  . Marital status: Single    Spouse name: Not on file  . Number of children: Not on file  . Years of education: Not on file  . Highest education level: Not on file  Occupational History  . Not on file  Tobacco Use  . Smoking status: Current Every Day Smoker    Packs/day: 0.50    Types: Cigarettes  . Smokeless tobacco: Never Used  Substance and Sexual Activity  . Alcohol use: Yes    Comment: Occasional 1-2 beers  . Drug use: Yes    Types: Marijuana    Comment: occasional THC Use  . Sexual activity: Not on file  Other Topics Concern  . Not on file  Social History Narrative  . Not on file   Social Determinants of Health   Financial Resource Strain: Not on file  Food Insecurity: Not on file  Transportation Needs: Not on file  Physical Activity: Not on file  Stress: Not on file  Social Connections: Not on file   Additional Social History:    Pain Medications: see MAR Prescriptions: see MAR Over the Counter: see MAR History of alcohol / drug use?: Yes Longest period of sobriety (when/how long): states that he has been slowing down on his use of alcohol and marijuana over the past three years Name of Substance 1: Alcohol 1 - Age of First Use: unknown 1 - Amount (size/oz): 6 pack -12 pack of beer 1 - Frequency: daily 1 - Duration: on-gong 1 - Last Use / Amount: "I stopped drinking Apirl 2022 after the  voices started coming back" 1 - Method of Aquiring: local stores 1- Route of Use: oral Name of Substance 2: THC 2 - Age of First Use: unknown 2 - Amount (size/oz): varied 2 - Frequency: occasional use 2 - Duration: on-going 2 - Last Use / Amount: "O s 2 - Method of Aquiring: "I stopped drinking Apirl 2022 after the voices started coming back" 2 - Route of Substance Use: inhalation; smoking                Sleep: Good  Appetite:  Fair  Current Medications: Current Facility-Administered Medications   Medication Dose Route Frequency Provider Last Rate Last Admin  . acetaminophen (TYLENOL) tablet 650 mg  650 mg Oral Q6H PRN Antonieta Pert, MD      . alum & mag hydroxide-simeth (MAALOX/MYLANTA) 200-200-20 MG/5ML suspension 30 mL  30 mL Oral Q4H PRN Antonieta Pert, MD      . benztropine (COGENTIN) tablet 0.5 mg  0.5 mg Oral Q6H PRN Claudie Revering, MD      . diphenhydrAMINE (BENADRYL) injection 25 mg  25 mg Intramuscular Q6H PRN Claudie Revering, MD      . hydrOXYzine (ATARAX/VISTARIL) tablet 25 mg  25 mg Oral TID PRN Antonieta Pert, MD   25 mg at 06/27/20 2040  . magnesium hydroxide (MILK OF MAGNESIA) suspension 30 mL  30 mL Oral Daily PRN Antonieta Pert, MD      . risperiDONE (RISPERDAL) tablet 1 mg  1 mg Oral Daily Claudie Revering, MD   1 mg at 06/28/20 1884  . risperiDONE (RISPERDAL) tablet 3 mg  3 mg Oral QHS Claudie Revering, MD   3 mg at 06/27/20 2041  . sertraline (ZOLOFT) tablet 50 mg  50 mg Oral Daily Claudie Revering, MD   50 mg at 06/28/20 0827  . traZODone (DESYREL) tablet 50 mg  50 mg Oral QHS PRN Antonieta Pert, MD   50 mg at 06/27/20 2041    Lab Results: No results found for this or any previous visit (from the past 48 hour(s)).  Blood Alcohol level:  Lab Results  Component Value Date   ETH <10 07/25/2019    Metabolic Disorder Labs: Lab Results  Component Value Date   HGBA1C 5.0 06/26/2020   MPG 96.8 06/26/2020   MPG 102.54 07/25/2019   Lab Results  Component Value Date   PROLACTIN 68.0 (H) 07/25/2019   Lab Results  Component Value Date   CHOL 187 06/26/2020   TRIG 127 06/26/2020   HDL 39 (L) 06/26/2020   CHOLHDL 4.8 06/26/2020   VLDL 25 06/26/2020   LDLCALC 123 (H) 06/26/2020   LDLCALC 96 07/25/2019    Physical Findings: AIMS:  , ,  ,  ,    CIWA:    COWS:     Musculoskeletal: Strength & Muscle Tone: within normal limits Gait & Station: normal Patient leans: N/A  Psychiatric Specialty Exam:  Presentation  General Appearance:  Disheveled  Eye Contact:Fair  Speech:Normal Rate  Speech Volume:Decreased  Handedness:Right   Mood and Affect  Mood:Dysphoric  Affect:Flat   Thought Process  Thought Processes:Coherent  Descriptions of Associations:Intact  Orientation:Full (Time, Place and Person)  Thought Content:Delusions; Paranoid Ideation  History of Schizophrenia/Schizoaffective disorder:Yes  Duration of Psychotic Symptoms:Greater than six months  Hallucinations:Hallucinations: Auditory  Ideas of Reference:Delusions; Paranoia  Suicidal Thoughts:Suicidal Thoughts: No  Homicidal Thoughts:Homicidal Thoughts: No   Sensorium  Memory:Immediate Fair; Recent Fair; Remote Fair  Judgment:Fair  Insight:Fair   Executive Functions  Concentration:Fair  Attention Span:Fair  Recall:Fair  Fund of Knowledge:Fair  Language:Fair   Psychomotor Activity  Psychomotor Activity:Psychomotor Activity: Decreased   Assets  Assets:Desire for Improvement; Resilience   Sleep  Sleep:Sleep: Good Number of Hours of Sleep: 8    Physical Exam: Physical Exam Vitals and nursing note reviewed.  HENT:     Head: Normocephalic and atraumatic.  Pulmonary:     Effort: Pulmonary effort is normal.  Neurological:     General: No focal deficit present.     Mental Status: He is alert and oriented to person, place, and time.    ROS Blood pressure 107/74, pulse 80, temperature 97.6 F (36.4 C), temperature source Oral, resp. rate 18, height 6\' 1"  (1.854 m), weight 88 kg, SpO2 100 %. Body mass index is 25.6 kg/m.   Treatment Plan Summary: Daily contact with patient to assess and evaluate symptoms and progress in treatment, Medication management and Plan : Patient is seen and examined.  Patient is a 25 year old male with the above-stated past psychiatric history who is seen in follow-up.   Diagnosis: 1.  Major depression, recurrent, severe with psychotic features 2.  Sinus bradycardia  Pertinent  findings on examination today: 1.  Auditory hallucinations continue to slowly decrease. 2.  Paranoia appears to be decreasing as well. 3.  Mood appears still to be flat. 4.  Sleep is improved.  Plan:  1. Continue Cogentin 0.5 mg p.o. every 6 hours as needed tremor. 2.  Continue hydroxyzine 25 mg p.o. 3 times daily as needed anxiety. 3.  Decrease Risperdal to 0.5 mg p.o. daily and continue 3 mg p.o. nightly for psychosis and mood stability. 4.  Increase sertraline to 75 mg p.o. daily for anxiety and depression. 5.  Continue trazodone 50 mg p.o. nightly as needed insomnia. 6.  Disposition planning-in progress.  30, MD 06/28/2020, 12:05 PM

## 2020-06-29 LAB — HEMOGLOBIN A1C
Hgb A1c MFr Bld: 5.1 % (ref 4.8–5.6)
Mean Plasma Glucose: 99.67 mg/dL

## 2020-06-29 MED ORDER — MELATONIN 3 MG PO TABS
3.0000 mg | ORAL_TABLET | Freq: Every day | ORAL | Status: DC
  Administered 2020-06-29 – 2020-07-01 (×3): 3 mg via ORAL
  Filled 2020-06-29 (×3): qty 1
  Filled 2020-06-29: qty 7
  Filled 2020-06-29 (×2): qty 1

## 2020-06-29 MED ORDER — NICOTINE POLACRILEX 2 MG MT GUM
2.0000 mg | CHEWING_GUM | OROMUCOSAL | Status: DC | PRN
  Administered 2020-06-29 – 2020-07-02 (×6): 2 mg via ORAL
  Filled 2020-06-29: qty 1

## 2020-06-29 NOTE — Progress Notes (Signed)
Riley [name] was in the day room all evening.  He denied any SI/HI or AVH.  He attended evening wrap up group.  He denied any pain or discomfort and appeared to be in no physical distress.  He reported that he smokes 1 ppd at home and nicotine gum ordered per protocol.  He took his bedtime medications without problem.  Q 15 minute checks maintained for safety.  We will continue to monitor the progress towards his goals.   06/29/20 2112  Psych Admission Type (Psych Patients Only)  Admission Status Voluntary  Psychosocial Assessment  Patient Complaints Anxiety  Eye Contact Fair  Facial Expression Flat;Pensive  Affect Flat  Speech Logical/coherent  Interaction Assertive  Motor Activity Other (Comment) (wdl)  Appearance/Hygiene Unremarkable  Behavior Characteristics Cooperative;Appropriate to situation  Mood Depressed  Thought Process  Coherency WDL  Content WDL  Delusions WDL  Perception Hallucinations  Hallucination Auditory  Judgment Impaired  Confusion None  Danger to Self  Current suicidal ideation? Denies  Danger to Others  Danger to Others None reported or observed

## 2020-06-29 NOTE — Progress Notes (Signed)
Adult Psychoeducational Group Note  Date:  06/29/2020 Time:  11:16 PM  Group Topic/Focus:  Wrap-Up Group:   The focus of this group is to help patients review their daily goal of treatment and discuss progress on daily workbooks.  Participation Level:  Active  Participation Quality:  Drowsy  Affect:  Depressed  Cognitive:  Appropriate  Insight: Appropriate  Engagement in Group:  Limited  Modes of Intervention:  Discussion  Additional Comments:  Pt stated his goal for today was to focus on his treatment plan. Pt stated he accomplished his goal today. Pt stated he talk with his doctor but did not get a chance to talk with his social worker about his care today. Pt rated his overall day a 6 out of 10. Pt stated he was able to contact his grandmother today which improved his day. Pt stated he felt better about himself today. Pt stated he attend groups today. Pt stated he attend all meals today. Pt stated he took all medications provided today. Pt stated his appetite was good today. Pt stated his sleep last night was pretty good. Pt stated the goal tonight was to get some rest tonight. Pt stated he had no physical pain tonight. Pt deny visual hallucinations and auditory issues tonight. Pt denies thoughts of harming himself or others. Pt stated he would alert staff if anything changed  Riley [name] 06/29/2020, 11:16 PM

## 2020-06-29 NOTE — B[facility] Group Notes (Signed)
Type of Therapy and Topic: Group Therapy: Anger Management   Participation: Attended  Description of Group: In this group, patients will learn helpful strategies and techniques to manage anger, express anger in alternative ways, change hostile attitudes, and prevent aggressive acts, such as verbal abuse and violence.This group will be process-oriented and eductional, with patients participating in exploration of their own experiences as well as giving and receiving support and challenge from other group members.  Therapeutic Goals: 1. Patient will learn to manage anger. 2. Patient will learn to stop violence or the threat of violence. 3. Patient will learn to develop self control over thoughts and actions. 4. Patient will receive support and feedback from others  Therapeutic Modalities: Cognitive Behavioral Therapy Solution Focused Therapy Motivational Interviewing  Palmira Stickle, LCSWA Clinicial Social Worker [facility] 

## 2020-06-29 NOTE — Progress Notes (Signed)
   06/29/20 0500  Sleep  Number of Hours 8.25

## 2020-06-29 NOTE — Progress Notes (Signed)
   06/29/20 1000  Psych Admission Type (Psych Patients Only)  Admission Status Voluntary  Psychosocial Assessment  Patient Complaints Depression  Eye Contact Fair  Facial Expression Flat;Pensive  Affect Anxious  Speech Logical/coherent  Interaction Assertive  Motor Activity Other (Comment) (WDL)  Appearance/Hygiene Unremarkable  Behavior Characteristics Cooperative  Mood Depressed  Thought Process  Coherency WDL  Content WDL  Delusions WDL  Perception Hallucinations  Hallucination Auditory  Judgment Impaired  Confusion None  Danger to Self  Current suicidal ideation? Denies  Danger to Others  Danger to Others None reported or observed

## 2020-06-29 NOTE — Progress Notes (Signed)
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  Objective: Medical record reviewed.  Patient's case discussed in detail with members of the treatment team.  I met with and evaluated the patient today for follow-up in his room on the unit.  The patient states that he is "a lot better than I was before."  He reports feeling calmer and states that the auditory hallucinations are "pretty much gone" and that he can only hear mumbling but cannot discern any particular words.  He denies visual hallucinations.  He is less paranoid.  Patient states that he still has some concerns that others might try to harm him outside the hospital but denies any concerns that anyone in the hospital is trying to harm him.  He denies passive wish for death or active suicidal ideation.  He denies AII or HI.  The patient reports that he feels drowsy and is sleeping more than he would like to.  He denies other medication side effects.  His appetite is okay.  The patient is considering staying with his brother in [place] after discharge or possibly with his grandmother in [place].  He has not spoken to his family members on the phone.  I encouraged him to speak to his family members on the phone since he was hearing their voices on admission and was worried that they might be trying to harm him.  Patient stated a willingness to try to make some calls today to see how he feels when he talks to them.  Patient slept 8.25 hours last night.  Vital signs this morning include BP of 126/74, pulse of 53, O2 sat of 100% and temperature of 97.7.  There are no new labs.  Patient is taking standing dose  medications as prescribed.  He took PRN trazodone and PRN hydroxyzine last night at bedtime for sleep and anxiety respectively.  Suspect some of his fatigue is due to use of PRN.  Patient states he does not feel that he needs to take sleeping medication and has not felt particularly anxious at bedtime.  The patient is attending groups and participating appropriately.  Principal Problem: Major depressive disorder, recurrent, severe with psychotic features ([place]) Diagnosis: Principal Problem:   Major depressive disorder, recurrent, severe with psychotic features ([place]) Active Problems:   MDD (major depressive disorder)   Psychosis ([place])  Total Time spent with patient: 20 minutes  Past Psychiatric History: See admission H&P  Past Medical History: History reviewed. No pertinent past medical history. History reviewed. No pertinent surgical history. Family History: History reviewed. No pertinent family history. Family Psychiatric  History: See admission H&P Social History:  Social History   Substance and Sexual Activity  Alcohol Use Yes   Comment: Occasional 1-2 beers     Social History   Substance and Sexual Activity  Drug Use Yes  . Types: Marijuana   Comment: occasional THC Use    Social History   Socioeconomic History  . Marital status: Single    Spouse name: Not on file  . Number of children: Not on file  . Years of education: Not on file  . Highest education level: Not on file  Occupational History  .  Not on file  Tobacco Use  . Smoking status: Current Every Day Smoker    Packs/day: 0.50    Types: Cigarettes  . Smokeless tobacco: Never Used  Substance and Sexual Activity  . Alcohol use: Yes    Comment: Occasional 1-2 beers  . Drug use: Yes    Types: Marijuana    Comment: occasional THC Use  . Sexual activity: Not on file  Other Topics Concern  . Not on file  Social History Narrative  . Not on file   Social Determinants of Health   Financial Resource Strain:  Not on file  Food Insecurity: Not on file  Transportation Needs: Not on file  Physical Activity: Not on file  Stress: Not on file  Social Connections: Not on file   Additional Social History:    Pain Medications: see MAR Prescriptions: see MAR Over the Counter: see MAR History of alcohol / drug use?: Yes Longest period of sobriety (when/how long): states that he has been slowing down on his use of alcohol and marijuana over the past three years Name of Substance 1: Alcohol 1 - Age of First Use: unknown 1 - Amount (size/oz): 6 pack -12 pack of beer 1 - Frequency: daily 1 - Duration: on-gong 1 - Last Use / Amount: "I stopped drinking Apirl 2022 after the voices started coming back" 1 - Method of Aquiring: local stores 1- Route of Use: oral Name of Substance 2: THC 2 - Age of First Use: unknown 2 - Amount (size/oz): varied 2 - Frequency: occasional use 2 - Duration: on-going 2 - Last Use / Amount: "O s 2 - Method of Aquiring: "I stopped drinking Apirl 2022 after the voices started coming back" 2 - Route of Substance Use: inhalation; smoking                Sleep: Good  Appetite:  Fair  Current Medications: Current Facility-Administered Medications  Medication Dose Route Frequency Provider Last Rate Last Admin  . acetaminophen (TYLENOL) tablet 650 mg  650 mg Oral Q6H PRN Sharma Covert, MD      . alum & mag hydroxide-simeth (MAALOX/MYLANTA) 200-200-20 MG/5ML suspension 30 mL  30 mL Oral Q4H PRN Sharma Covert, MD      . benztropine (COGENTIN) tablet 0.5 mg  0.5 mg Oral Q6H PRN Arthor Captain, MD      . diphenhydrAMINE (BENADRYL) injection 25 mg  25 mg Intramuscular Q6H PRN Arthor Captain, MD      . hydrOXYzine (ATARAX/VISTARIL) tablet 25 mg  25 mg Oral TID PRN Sharma Covert, MD   25 mg at 06/28/20 2044  . magnesium hydroxide (MILK OF MAGNESIA) suspension 30 mL  30 mL Oral Daily PRN Sharma Covert, MD      . risperiDONE (RISPERDAL) tablet 0.5 mg  0.5 mg  Oral Daily Sharma Covert, MD   0.5 mg at 06/29/20 0815  . risperiDONE (RISPERDAL) tablet 3 mg  3 mg Oral QHS Arthor Captain, MD   3 mg at 06/28/20 2044  . sertraline (ZOLOFT) tablet 75 mg  75 mg Oral Daily Sharma Covert, MD   75 mg at 06/29/20 0815  . traZODone (DESYREL) tablet 50 mg  50 mg Oral QHS PRN Sharma Covert, MD   50 mg at 06/28/20 2044    Lab Results:  Results for orders placed or performed during the hospital encounter of 06/25/20 (from the past 48 hour(s))  Lipid panel  Status: Abnormal   Collection Time: 06/28/20  5:34 PM  Result Value Ref Range   Cholesterol 175 0 - 200 mg/dL   Triglycerides 380 (H) <150 mg/dL   HDL 36 (L) >40 mg/dL   Total CHOL/HDL Ratio 4.9 RATIO   VLDL 76 (H) 0 - 40 mg/dL   LDL Cholesterol 63 0 - 99 mg/dL    Comment:        Total Cholesterol/HDL:CHD Risk Coronary Heart Disease Risk Table                     Men   Women  1/2 Average Risk   3.4   3.3  Average Risk       5.0   4.4  2 X Average Risk   9.6   7.1  3 X Average Risk  23.4   11.0        Use the calculated Patient Ratio above and the CHD Risk Table to determine the patient's CHD Risk.        ATP III CLASSIFICATION (LDL):  <100     mg/dL   Optimal  100-129  mg/dL   Near or Above                    Optimal  130-159  mg/dL   Borderline  160-189  mg/dL   High  >190     mg/dL   Very High Performed at [facility] [address] [place], [place] [address]   Hemoglobin A1c     Status: None   Collection Time: 06/28/20  5:34 PM  Result Value Ref Range   Hgb A1c MFr Bld 5.1 4.8 - 5.6 %    Comment: (NOTE) Pre diabetes:          5.7%-6.4%  Diabetes:              >6.4%  Glycemic control for   <7.0% adults with diabetes    Mean Plasma Glucose 99.67 mg/dL    Comment: Performed at [facility] [address], [place], [place] [address]    Blood Alcohol level:  Lab Results  Component Value Date   ETH <10 09/81/1914    Metabolic Disorder  Labs: Lab Results  Component Value Date   HGBA1C 5.1 06/28/2020   MPG 99.67 06/28/2020   MPG 96.8 06/26/2020   Lab Results  Component Value Date   PROLACTIN 68.0 (H) 07/25/2019   Lab Results  Component Value Date   CHOL 175 06/28/2020   TRIG 380 (H) 06/28/2020   HDL 36 (L) 06/28/2020   CHOLHDL 4.9 06/28/2020   VLDL 76 (H) 06/28/2020   LDLCALC 63 06/28/2020   LDLCALC 123 (H) 06/26/2020    Physical Findings: AIMS:  , ,  ,  ,    CIWA:    COWS:     Musculoskeletal: Strength & Muscle Tone: within normal limits Gait & Station: normal Patient leans: N/A  Psychiatric Specialty Exam:  Presentation  General Appearance: Other (comment) (Unkempt)  Eye Contact:Fair  Speech:Clear and Coherent; Normal Rate  Speech Volume:Normal  Handedness:Right   Mood and Affect  Mood:Dysphoric; Depressed ("Alright.")  Affect:Flat   Thought Process  Thought Processes:Coherent  Descriptions of Associations:Intact  Orientation:Full (Time, Place and Person)  Thought Content:Paranoid Ideation  History of Schizophrenia/Schizoaffective disorder:Yes  Duration of Psychotic Symptoms:Greater than six months  Hallucinations:Hallucinations: Auditory Description of Auditory Hallucinations: Mumbling only: Cannot make out what is being said  Ideas of  Reference:Paranoia  Suicidal Thoughts:Suicidal Thoughts: No  Homicidal Thoughts:Homicidal Thoughts: No   Sensorium  Memory:Immediate Fair; Recent Fair; Remote Fair  Judgment:Fair  Insight:Fair   Executive Functions  Concentration:Fair  Attention Span:Fair  [place]  Language:Good   Psychomotor Activity  Psychomotor Activity:Psychomotor Activity: Decreased   Assets  Assets:Desire for Improvement; Resilience; Social Support   Sleep  Sleep:Sleep: Good Number of Hours of Sleep: 8.25    Physical Exam: Physical Exam Vitals and nursing note reviewed.  HENT:     Head: Normocephalic and  atraumatic.  Pulmonary:     Effort: Pulmonary effort is normal.  Neurological:     General: No focal deficit present.     Mental Status: He is alert and oriented to person, place, and time.    ROS Blood pressure 126/74, pulse (!) 53, temperature 97.7 F (36.5 C), temperature source Oral, resp. rate 18, height _0  (1.854 m), weight 88 kg, SpO2 100 %. Body mass index is 25.6 kg/m.   Treatment Plan Summary: Daily contact with patient to assess and evaluate symptoms and progress in treatment, Medication management and Plan : Patient is seen and examined.  Patient is a 25 year old male with the above-stated past psychiatric history who is seen in follow-up.   Diagnosis: 1.  Major depression, recurrent, severe with psychotic features 2.  Sinus bradycardia  Pertinent findings on examination today: 1.  Auditory hallucinations continue to slowly decrease. 2.  Paranoia continues to decrease. 3.  Mood remains depressed and affect remains flat 4.  Anxiety has diminished 5.  The patient is sleeping well  Plan:  1.  Continue Risperdal 0.5 mg p.o. daily and 3 mg p.o. nightly for psychosis and mood stability. 2.  Continue sertraline 75 mg p.o. daily for anxiety and depression 3.  Continue Cogentin 0.5 mg p.o. every 6 hours as needed tremor. 4.  Discontinue hydroxyzine 25 mg p.o. 3 times daily as needed anxiety as patient denies anxiety, complains of fatigue and appears a bit sedated in the morning. 5.  Discontinue trazodone 50 mg p.o. nightly as needed insomnia as patient is sleeping adequately, reports he feels he is sleeping too much, complains of fatigue and appears a bit sedated in the morning. 6.  Disposition planning-in progress.  Arthor Captain, MD 06/29/2020, 2:49 PM

## 2020-06-29 NOTE — Progress Notes (Signed)
Recreation Therapy Notes  Date: 5.9.22 Time: 1000 Location: 500 Hall Dayroom   Group Topic: Coping Skills   Goal Area(s) Addresses: Patient will define what [name] coping skill is. Patient will work with peer to create [name] list of healthy coping skills beginning with each letter of the alphabet. Patient will successfully identify positive coping skills they can use post d/c.  Patient will acknowledge benefit(s) of using learned coping skills post d/c.   Behavioral Response:  Minimal,Attentive   Intervention: Group work   Activity: Coping [name] to Z. Patient asked to identify what [name] coping skill is and when they use them. Patients with Clinical research associate discussed healthy versus unhealthy coping skills. Next patients were given [name] blank worksheet titled "Coping Skills [name]-Z" and asked to pair up with [name] peer. Partners were instructed to come up with at least one positive coping skill per letter of the alphabet, addressing [name] specific challenge (ex: stress, anger, anxiety, depression, grief, doubt, isolation, self-harm/suicidal thoughts, substance use). Patients were given 15 minutes to brainstorm with their peer, before ideas were presented to the large group. Patients and LRT debriefed on the importance of coping skill selection based on situation and back-up plans when [name] skill tried is not effective. At the end of group, patients were given an handout of alphabetized strategies to keep for future reference.  Education: Pharmacologist, Scientist, physiological, Discharge Planning.    Education Outcome: Acknowledges education/Verbalizes understanding/In group clarification offered/Additional education needed   Clinical Observations/Feedback: Pt was quiet and mostly observed within his group and the larger group.  Pt did share it was easier to come up negative coping skills but couldn't elaborate.  Pt appeared flat but attentive to peers as they spoke.       Riley [name], Riley [name]    Riley [name], Riley [name] 06/29/2020 11:12  AM

## 2020-06-30 NOTE — [facility] Group Notes (Signed)
[facility] Group Notes:  (Nursing/MHT/Case Management/Adjunct)  Date:  06/30/2020  Time:  9:34 AM  Type of Therapy:  Group Therapy  Participation Level:  Active  Participation Quality:  Appropriate  Affect:  Appropriate  Cognitive:  Alert and Appropriate  Insight:  Appropriate  Engagement in Group:  Engaged  Modes of Intervention:  Orientation  Summary of Progress/Problems: His goal for today is to talk to family and to figure out where he is going to eat once he gets out of here.   Panzy Bubeck J Kaylise Blakeley 06/30/2020, 9:34 AM

## 2020-06-30 NOTE — BHH Counselor (Signed)
Pt states that he will live with his brother when he discharges. He plans to call him tomorrow when he comes home from [place].   Riley [name], LCSWA Clinicial Social Worker [employer]

## 2020-06-30 NOTE — Progress Notes (Signed)
Recreation Therapy Notes  Date: 5.10.22 Time: 1000 Location: 500 Hall Dayroom  Group Topic: Communication  Goal Area(s) Addresses:  Patient will effectively communicate with peers in group.  Patient will verbalize benefit of healthy communication. Patient will verbalize positive effect of healthy communication on post d/c goals.  Patient will identify communication techniques that made activity effective for group.   Behavioral Response: Active  Intervention: Paper, Pencils, Geometrical Shapes  Activity: Draw It How I Say It.  Three volunteers would get a picture.  Each person would describe their picture to the rest of the group.  The individuals describing there pictures were to be as detailed as possible.  The remaining group members could only ask them to repeat themselves.  When finished each presenter would let the group see the original picture and compare the pictures on the group members to the original.  Education: Communication, Discharge Planning  Education Outcome: Acknowledges understanding/In group clarification offered/Needs additional education.   Clinical Observations/Feedback: Pt was attentive and focused on the activity.  Pt presented one of the pictures to the group and his peers felt he did a good job.  When asked how he felt he did, pt stated "I could have done better explaining it".  During processing, pt explained one of the things about communication that can cause problems is people not always getting what you say.  Pt went on to say after you've made multiple attempts to explain yourself using references and everything and the other person still doesn't get it, makes you frustrated.      Caroll Rancher, LRT/CTRS    Caroll Rancher A 06/30/2020 12:46 PM

## 2020-06-30 NOTE — Progress Notes (Signed)
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  Objective: Medical record reviewed.  Patient's case discussed in detail with members of the treatment team.  I met with and evaluated the patient today for follow-up.  Patient states that he is feeling better.  He describes his mood as "normal" and denies depressed mood or anhedonia.  Patient states that people always have difficulty knowing how he feels and that he does not show his emotions much but he feels back to his baseline mood wise.  He denies passive wish for death or suicidal ideation.  He denies thoughts of harming others.  Patient reports that the voices have diminished and he has not heard any voices thus far today.  Yesterday he heard only mumbling.  He denies any visual hallucinations.  The patient continues to have some mild paranoia about family members and others who may want to hurt him.  He felt better after speaking with his grandmother yesterday by phone.  Patient reported feeling relieved that his grandmother had contacted him.  He has not yet made contact with his brother and is anxious about doing so.  He remains uncertain as to where he will reside after discharge and will either stay in [place] with his brother or go to [place] to stay with his grandmother.  I encouraged him to speak with family by phone today in an effort to clarify his plans post discharge and also to see whether he feels anxious or suspicious upon speaking to them.  He continues to report feeling safe in the hospital because he knows that people he was worried were trying to harm him prior to admission cannot access the  hospital environment.  The patient denies any medication side effects.  He denies any physical problems.  The patient reports good appetite and describes his sleep as fair.  He slept 7.5 hours last night.  No new labs.  Vital signs this morning include BP of 125/75 sitting and 127/82 standing, pulse of 61 sitting and 97 standing, respirations of 18, temperature of 97.7 and O2 sat of 100%.  The patient has been taking standing dose medications as prescribed.  He has not required any as needed medications.  The patient has been attending groups and participating appropriately.     Principal Problem: Major depressive disorder, recurrent, severe with psychotic features ([place]) Diagnosis: Principal Problem:   Major depressive disorder, recurrent, severe with psychotic features ([place]) Active Problems:   MDD (major depressive disorder)   Psychosis ([place])  Total Time spent with patient: 20 minutes  Past Psychiatric History: See H&P  Past Medical History: History reviewed. No pertinent past medical history. History reviewed. No pertinent surgical history. Family History: History reviewed. No pertinent family history. Family Psychiatric  History: See H&P Social History:  Social History   Substance and Sexual Activity  Alcohol Use Yes   Comment: Occasional 1-2 beers     Social History   Substance and Sexual Activity  Drug Use Yes  . Types: Marijuana   Comment: occasional THC Use    Social History   Socioeconomic History  . Marital status: Single    Spouse name: Not on file  .  Number of children: Not on file  . Years of education: Not on file  . Highest education level: Not on file  Occupational History  . Not on file  Tobacco Use  . Smoking status: Current Every Day Smoker    Packs/day: 0.50    Types: Cigarettes  . Smokeless tobacco: Never Used  Substance and Sexual Activity  . Alcohol use: Yes    Comment: Occasional 1-2 beers  . Drug use: Yes    Types: Marijuana    Comment:  occasional THC Use  . Sexual activity: Not on file  Other Topics Concern  . Not on file  Social History Narrative  . Not on file   Social Determinants of Health   Financial Resource Strain: Not on file  Food Insecurity: Not on file  Transportation Needs: Not on file  Physical Activity: Not on file  Stress: Not on file  Social Connections: Not on file   Additional Social History:    Pain Medications: see MAR Prescriptions: see MAR Over the Counter: see MAR History of alcohol / drug use?: Yes Longest period of sobriety (when/how long): states that he has been slowing down on his use of alcohol and marijuana over the past three years Name of Substance 1: Alcohol 1 - Age of First Use: unknown 1 - Amount (size/oz): 6 pack -12 pack of beer 1 - Frequency: daily 1 - Duration: on-gong 1 - Last Use / Amount: "I stopped drinking Apirl 2022 after the voices started coming back" 1 - Method of Aquiring: local stores 1- Route of Use: oral Name of Substance 2: THC 2 - Age of First Use: unknown 2 - Amount (size/oz): varied 2 - Frequency: occasional use 2 - Duration: on-going 2 - Last Use / Amount: "O s 2 - Method of Aquiring: "I stopped drinking Apirl 2022 after the voices started coming back" 2 - Route of Substance Use: inhalation; smoking                Sleep: Fair  Appetite:  Good  Current Medications: Current Facility-Administered Medications  Medication Dose Route Frequency Provider Last Rate Last Admin  . acetaminophen (TYLENOL) tablet 650 mg  650 mg Oral Q6H PRN Sharma Covert, MD      . alum & mag hydroxide-simeth (MAALOX/MYLANTA) 200-200-20 MG/5ML suspension 30 mL  30 mL Oral Q4H PRN Sharma Covert, MD      . benztropine (COGENTIN) tablet 0.5 mg  0.5 mg Oral Q6H PRN Arthor Captain, MD      . diphenhydrAMINE (BENADRYL) injection 25 mg  25 mg Intramuscular Q6H PRN Arthor Captain, MD      . magnesium hydroxide (MILK OF MAGNESIA) suspension 30 mL  30 mL Oral  Daily PRN Sharma Covert, MD      . melatonin tablet 3 mg  3 mg Oral QHS Arthor Captain, MD   3 mg at 06/29/20 2104  . nicotine polacrilex (NICORETTE) gum 2 mg  2 mg Oral PRN Sharma Covert, MD   2 mg at 06/29/20 2112  . risperiDONE (RISPERDAL) tablet 0.5 mg  0.5 mg Oral Daily Sharma Covert, MD   0.5 mg at 06/30/20 0806  . risperiDONE (RISPERDAL) tablet 3 mg  3 mg Oral QHS Arthor Captain, MD   3 mg at 06/29/20 2104  . sertraline (ZOLOFT) tablet 75 mg  75 mg Oral Daily Sharma Covert, MD   75 mg at 06/30/20 0805    Lab Results:  Results for orders placed or performed during the hospital encounter of 06/25/20 (from the past 48 hour(s))  Lipid panel     Status: Abnormal   Collection Time: 06/28/20  5:34 PM  Result Value Ref Range   Cholesterol 175 0 - 200 mg/dL   Triglycerides 380 (H) <150 mg/dL   HDL 36 (L) >40 mg/dL   Total CHOL/HDL Ratio 4.9 RATIO   VLDL 76 (H) 0 - 40 mg/dL   LDL Cholesterol 63 0 - 99 mg/dL    Comment:        Total Cholesterol/HDL:CHD Risk Coronary Heart Disease Risk Table                     Men   Women  1/2 Average Risk   3.4   3.3  Average Risk       5.0   4.4  2 X Average Risk   9.6   7.1  3 X Average Risk  23.4   11.0        Use the calculated Patient Ratio above and the CHD Risk Table to determine the patient's CHD Risk.        ATP III CLASSIFICATION (LDL):  <100     mg/dL   Optimal  100-129  mg/dL   Near or Above                    Optimal  130-159  mg/dL   Borderline  160-189  mg/dL   High  >190     mg/dL   Very High Performed at [facility] [address] [place], [place] [address]   Hemoglobin A1c     Status: None   Collection Time: 06/28/20  5:34 PM  Result Value Ref Range   Hgb A1c MFr Bld 5.1 4.8 - 5.6 %    Comment: (NOTE) Pre diabetes:          5.7%-6.4%  Diabetes:              >6.4%  Glycemic control for   <7.0% adults with diabetes    Mean Plasma Glucose 99.67 mg/dL    Comment: Performed at  [facility] [address], [place], [place] [address]    Blood Alcohol level:  Lab Results  Component Value Date   ETH <10 38/46/6599    Metabolic Disorder Labs: Lab Results  Component Value Date   HGBA1C 5.1 06/28/2020   MPG 99.67 06/28/2020   MPG 96.8 06/26/2020   Lab Results  Component Value Date   PROLACTIN 68.0 (H) 07/25/2019   Lab Results  Component Value Date   CHOL 175 06/28/2020   TRIG 380 (H) 06/28/2020   HDL 36 (L) 06/28/2020   CHOLHDL 4.9 06/28/2020   VLDL 76 (H) 06/28/2020   LDLCALC 63 06/28/2020   LDLCALC 123 (H) 06/26/2020    Physical Findings: AIMS:  , ,  ,  ,    CIWA:    COWS:     Musculoskeletal: Strength & Muscle Tone: within normal limits Gait & Station: normal Patient leans: N/A  Psychiatric Specialty Exam:  Presentation  General Appearance: Casual; Appropriate for Environment  Eye Contact:Fair  Speech:Clear and Coherent; Normal Rate  Speech Volume:Normal  Handedness:Right   Mood and Affect  Mood:Euthymic ("Normal")  Affect:Flat   Thought Process  Thought Processes:Coherent; Goal Directed  Descriptions of Associations:Intact  Orientation:Full (Time, Place and Person)  Thought Content:Paranoid Ideation; Other (comment) (Paranoid ideation of decreased  intensity)  History of Schizophrenia/Schizoaffective disorder:Yes  Duration of Psychotic Symptoms:Greater than six months  Hallucinations:Hallucinations: None (Denies experiencing any auditory hallucinations today) Description of Auditory Hallucinations: Mumbling only: Cannot make out what is being said  Ideas of Reference:Paranoia (Decreased intensity of paranoid ideation)  Suicidal Thoughts:Suicidal Thoughts: No  Homicidal Thoughts:Homicidal Thoughts: No   Sensorium  Memory:Immediate Fair; Recent Fair; Remote Good  Judgment:Fair  Insight:Fair   Executive Functions  Concentration:Fair  Attention Span:Fair  Isabela  Language:Good   Psychomotor Activity  Psychomotor Activity:Psychomotor Activity: Normal   Assets  Assets:Communication Skills; Desire for Improvement; Housing; Resilience; Social Support   Sleep  Sleep:Sleep: Good Number of Hours of Sleep: 7.5    Physical Exam: Physical Exam Vitals and nursing note reviewed.  HENT:     Head: Normocephalic and atraumatic.  Neurological:     General: No focal deficit present.     Mental Status: He is alert and oriented to person, place, and time.    Review of Systems  Constitutional: Negative for diaphoresis and fever.  HENT: Negative for hearing loss and sore throat.   Respiratory: Negative for cough and shortness of breath.   Cardiovascular: Negative for chest pain and palpitations.  Gastrointestinal: Negative for constipation, diarrhea, nausea and vomiting.  Musculoskeletal: Negative.   Neurological: Negative for dizziness, tremors, speech change, seizures and headaches.  Psychiatric/Behavioral: Positive for hallucinations. Negative for suicidal ideas. The patient is nervous/anxious. The patient does not have insomnia.    Blood pressure 127/82, pulse 97, temperature 97.7 F (36.5 C), temperature source Oral, resp. rate 18, height _0  (1.854 m), weight 88 kg, SpO2 100 %. Body mass index is 25.6 kg/m.   Treatment Plan Summary: Daily contact with patient to assess and evaluate symptoms and progress in treatment and Medication management   Diagnosis: 1. Major depression, recurrent, severe with psychotic features 2. Sinus bradycardia  Pertinent findings on examination today: 1.  Auditory hallucinations are resolving. 2.  Paranoia continues to decrease. 3.  Mood has improved and affect remains flat 4.  Anxiety has diminished 5.  The patient is sleeping well, reports feeling less tired and appears less tired on exam  Plan:  1.  Continue Risperdal 0.5 mg p.o. daily and 3 mg p.o. nightly for psychosis and mood  stability. 2.  Continue sertraline 75 mg p.o. daily for anxiety and depression 3.  Continue Cogentin 0.5 mg p.o. every 6 hours as needed tremor. 4.  Continue melatonin 3 mg at bedtime for sleep 6.  Disposition planning-in progress.  Social work is trying to clarify where patient will live after discharge so that aftercare appointments can be arranged.   Arthor Captain, MD 06/30/2020, 11:28 AM

## 2020-06-30 NOTE — Progress Notes (Signed)
Pt calm and cooperative this shift.  Denies feeling depressed.  Described mood as "content."  RN assessed for needs and concerns and provided support.  Pt is coping well at this time.  Pt remains safe with q 15 min checks in place.

## 2020-06-30 NOTE — [facility] Suicide Risk Assessment (Signed)
[facility] INPATIENT:  Family/Significant Other Suicide Prevention Education  Suicide Prevention Education:  Education Completed;  Serina Cowper (grandma) [phone],  (name of family member/significant other) has been identified by the patient as the family member/significant other with whom the patient will be residing, and identified as the person(s) who will aid the patient in the event of a mental health crisis (suicidal ideations/suicide attempt).  With written consent from the patient, the family member/significant other has been provided the following suicide prevention education, prior to the and/or following the discharge of the patient.  The suicide prevention education provided includes the following:  Suicide risk factors  Suicide prevention and interventions  National Suicide Hotline telephone number  [facility] assessment telephone number  [facility] Emergency Assistance 911  [facility] and/or Residential Mobile Crisis Unit telephone number  Request made of family/significant other to:  Remove weapons (e.g., guns, rifles, knives), all items previously/currently identified as safety concern.    Remove drugs/medications (over-the-counter, prescriptions, illicit drugs), all items previously/currently identified as a safety concern.  The family member/significant other verbalizes understanding of the suicide prevention education information provided.  The family member/significant other agrees to remove the items of safety concern listed above.  When CSW asked what brought pt to the hospital, Ms. Revonda Humphrey stated, "He had reached a point of depression and said he was hearing voices. I think this was brought on by the death of his mother. He has been living in the home he lived in with his mother in [place]. He can live with me or his brother when he discharges if he would like.". No safety concerns reported.   Felizardo Hoffmann 06/30/2020, 11:53 AM

## 2020-07-01 NOTE — Tx Team (Signed)
Interdisciplinary Treatment and Diagnostic Plan Update  07/01/2020 Time of Session: 9:20am Riley [name] MRN: [id number]  Principal Diagnosis: Major depressive disorder, recurrent, severe with psychotic features (HCC)  Secondary Diagnoses: Principal Problem:   Major depressive disorder, recurrent, severe with psychotic features (HCC) Active Problems:   MDD (major depressive disorder)   Psychosis (HCC)   Current Medications:  Current Facility-Administered Medications  Medication Dose Route Frequency Provider Last Rate Last Admin  . acetaminophen (TYLENOL) tablet 650 mg  650 mg Oral Q6H PRN Antonieta Pert, MD      . alum & mag hydroxide-simeth (MAALOX/MYLANTA) 200-200-20 MG/5ML suspension 30 mL  30 mL Oral Q4H PRN Antonieta Pert, MD      . benztropine (COGENTIN) tablet 0.5 mg  0.5 mg Oral Q6H PRN Claudie Revering, MD      . diphenhydrAMINE (BENADRYL) injection 25 mg  25 mg Intramuscular Q6H PRN Claudie Revering, MD      . magnesium hydroxide (MILK OF MAGNESIA) suspension 30 mL  30 mL Oral Daily PRN Antonieta Pert, MD      . melatonin tablet 3 mg  3 mg Oral QHS Claudie Revering, MD   3 mg at 06/30/20 2147  . nicotine polacrilex (NICORETTE) gum 2 mg  2 mg Oral PRN Antonieta Pert, MD   2 mg at 07/01/20 0924  . risperiDONE (RISPERDAL) tablet 0.5 mg  0.5 mg Oral Daily Antonieta Pert, MD   0.5 mg at 07/01/20 0819  . risperiDONE (RISPERDAL) tablet 3 mg  3 mg Oral QHS Claudie Revering, MD   3 mg at 06/30/20 2147  . sertraline (ZOLOFT) tablet 75 mg  75 mg Oral Daily Antonieta Pert, MD   75 mg at 07/01/20 2694   PTA Medications: Medications Prior to Admission  Medication Sig Dispense Refill Last Dose  . risperiDONE (RISPERDAL M-TABS) 2 MG disintegrating tablet Take 1 tablet (2 mg total) by mouth at bedtime. 30 tablet 0     Patient Stressors:    Patient Strengths:    Treatment Modalities: Medication Management, Group therapy, Case management,  1 to 1 session with  clinician, Psychoeducation, Recreational therapy.   Physician Treatment Plan for Primary Diagnosis: Major depressive disorder, recurrent, severe with psychotic features (HCC) Long Term Goal(s): Improvement in symptoms so as ready for discharge   Short Term Goals: Ability to identify changes in lifestyle to reduce recurrence of condition will improve Ability to verbalize feelings will improve Ability to disclose and discuss suicidal ideas Ability to demonstrate self-control will improve Ability to identify and develop effective coping behaviors will improve Compliance with prescribed medications will improve Ability to identify triggers associated with substance abuse/mental health issues will improve  Medication Management: Evaluate patient's response, side effects, and tolerance of medication regimen.  Therapeutic Interventions: 1 to 1 sessions, Unit Group sessions and Medication administration.  Evaluation of Outcomes: Progressing  Physician Treatment Plan for Secondary Diagnosis: Principal Problem:   Major depressive disorder, recurrent, severe with psychotic features (HCC) Active Problems:   MDD (major depressive disorder)   Psychosis (HCC)  Long Term Goal(s): Improvement in symptoms so as ready for discharge   Short Term Goals: Ability to identify changes in lifestyle to reduce recurrence of condition will improve Ability to verbalize feelings will improve Ability to disclose and discuss suicidal ideas Ability to demonstrate self-control will improve Ability to identify and develop effective coping behaviors will improve Compliance with prescribed medications will improve Ability to identify triggers associated with substance  abuse/mental health issues will improve     Medication Management: Evaluate patient's response, side effects, and tolerance of medication regimen.  Therapeutic Interventions: 1 to 1 sessions, Unit Group sessions and Medication  administration.  Evaluation of Outcomes: Progressing   RN Treatment Plan for Primary Diagnosis: Major depressive disorder, recurrent, severe with psychotic features (HCC) Long Term Goal(s): Knowledge of disease and therapeutic regimen to maintain health will improve  Short Term Goals: Ability to demonstrate self-control, Ability to participate in decision making will improve and Ability to verbalize feelings will improve  Medication Management: RN will administer medications as ordered by provider, will assess and evaluate patient's response and provide education to patient for prescribed medication. RN will report any adverse and/or side effects to prescribing provider.  Therapeutic Interventions: 1 on 1 counseling sessions, Psychoeducation, Medication administration, Evaluate responses to treatment, Monitor vital signs and CBGs as ordered, Perform/monitor CIWA, COWS, AIMS and Fall Risk screenings as ordered, Perform wound care treatments as ordered.  Evaluation of Outcomes: Progressing   LCSW Treatment Plan for Primary Diagnosis: Major depressive disorder, recurrent, severe with psychotic features (HCC) Long Term Goal(s): Safe transition to appropriate next level of care at discharge, Engage patient in therapeutic group addressing interpersonal concerns.  Short Term Goals: Engage patient in aftercare planning with referrals and resources, Increase social support and Increase ability to appropriately verbalize feelings  Therapeutic Interventions: Assess for all discharge needs, 1 to 1 time with Social worker, Explore available resources and support systems, Assess for adequacy in community support network, Educate family and significant other(s) on suicide prevention, Complete Psychosocial Assessment, Interpersonal group therapy.  Evaluation of Outcomes: Progressing   Progress in Treatment: Attending groups: No. Participating in groups: No. Taking medication as prescribed:  Yes. Toleration medication: Yes. Family/Significant other contact made: Yes, individual(s) contacted:  with grandmother Patient understands diagnosis: Yes. Discussing patient identified problems/goals with staff: No. Medical problems stabilized or resolved: Yes. Denies suicidal/homicidal ideation: Yes. Issues/concerns per patient self-inventory: No. Other: None  New problem(s) identified: No, Describe:  None  New Short Term/Long Term Goal(s):medication stabilization, elimination of SI thoughts, development of comprehensive mental wellness plan.  Patient Goals:  Pt did not attend due to sleeping.  Discharge Plan or Barriers: Patient is to return to live with brother. Pt is to follow up for medication management and therapy on an outpatient basis.  Reason for Continuation of Hospitalization: Hallucinations Medication stabilization Suicidal ideation  Estimated Length of Stay: 3-5 days  Attendees: Patient: 06/26/2020   Physician:  06/26/2020   Nursing:  06/26/2020   RN Care Manager: 06/26/2020   Social Worker: Ruthann Cancer, LCSW 06/26/2020   Recreational Therapist:  06/26/2020   Other:  06/26/2020   Other:  06/26/2020   Other: 06/26/2020       Scribe for Treatment Team: Otelia Santee, LCSW 07/01/2020 11:45 AM

## 2020-07-01 NOTE — Progress Notes (Signed)
Patient shared in group that he went outside and that was all. His goal for tomorrow is to get discharged.

## 2020-07-01 NOTE — Progress Notes (Signed)
D: Patient presents with flat affect but is appropriate at time of assessment. Patient denies SI/HI at this time. Patient also denies AH/VH at this time. Patient contracts for safety.  A: Provided positive reinforcement and encouragement.  R: Patient cooperative and receptive to efforts. Patient remains safe on the unit.   07/01/20 2129  Psych Admission Type (Psych Patients Only)  Admission Status Voluntary  Psychosocial Assessment  Patient Complaints None  Eye Contact Fair  Facial Expression Flat;Pensive;Sad  Affect Appropriate to circumstance;Flat  Speech Logical/coherent;Slow;Soft  Interaction Assertive  Motor Activity Pacing  Appearance/Hygiene Unremarkable  Behavior Characteristics Cooperative;Appropriate to situation  Mood Pleasant  Thought Process  Coherency WDL  Content WDL  Delusions None reported or observed  Perception WDL  Hallucination None reported or observed  Judgment Impaired  Confusion None  Danger to Self  Current suicidal ideation? Denies  Danger to Others  Danger to Others None reported or observed

## 2020-07-01 NOTE — Progress Notes (Signed)
   D:  Patient presents with a flat affect and an anxious mood tonight.  Patient was observed pacing in hallway and sitting in day room with peers.  Patient rates his day 8/10.  Patient denies SI/HI and AVH.   A:  Labs/Vitals monitored; Medication education provided; Patient supported emotionally; Patient asked to communicate his needs, concerns, and questions.  R:  Patient remains safe with 15 minute checks; Will continue to monitor.

## 2020-07-01 NOTE — BHH Counselor (Signed)
CSW encouraged pt to call his brother for living arrangements at d/c.  Fredirick Lathe, LCSWA Clinicial Social Worker [employer]

## 2020-07-01 NOTE — Progress Notes (Signed)
    06/30/20 2147  Psych Admission Type (Psych Patients Only)  Admission Status Voluntary  Psychosocial Assessment  Patient Complaints None  Eye Contact Fair  Facial Expression Flat;Pensive;Sad  Affect Flat  Speech Logical/coherent;Soft;Slow  Interaction Assertive  Motor Activity Pacing  Appearance/Hygiene Unremarkable  Behavior Characteristics Cooperative;Appropriate to situation;Calm  Mood Anxious  Thought Process  Coherency WDL  Content WDL  Delusions None reported or observed  Perception Hallucinations  Hallucination Auditory  Judgment Impaired  Confusion None  Danger to Self  Current suicidal ideation? Denies  Danger to Others  Danger to Others None reported or observed

## 2020-07-01 NOTE — Progress Notes (Signed)
   07/01/20 0500  Sleep  Number of Hours 6.75   

## 2020-07-01 NOTE — BHH Group Notes (Signed)
LCSW Group Therapy Note  Type of Therapy/Topic: Group Therapy: Six Dimensions of Wellness  Participation Level: Active  Description of Group: This group will address the concept of wellness and the six concepts of wellness: occupational, physical, social, intellectual, spiritual, and emotional. Patients will be encouraged to process areas in their lives that are out of balance and identify reasons for remaining unbalanced. Patients will be encouraged to explore ways to practice healthy habits daily to attain better physical and mental health outcomes.  Therapeutic Goals: 1. Identify aspects of wellness that they are doing well. 2. Identify aspects of wellness that they would like to improve upon. 3. Identify one action they can take to improve an aspect of wellness in their lives.   Therapeutic Modalities: Cognitive Behavioral Therapy Solution-Focused Therapy Relapse Prevention  Riley [name], LCSWA Clinicial Social Worker [facility] 

## 2020-07-01 NOTE — BHH Group Notes (Signed)
Adult Psychoeducational Group Note  Date:  07/01/2020 Time:  11:38 AM  Group Topic/Focus:  Personal Choices and Values:   The focus of this group is to help patients assess and explore the importance of values in their lives, how their values affect their decisions, how they express their values and what opposes their expression.  Participation Level:  Minimal  Participation Quality:  Appropriate and Attentive  Affect:  Flat  Cognitive:  Appropriate  Insight: Appropriate and Good  Engagement in Group:  Engaged  Modes of Intervention:  Discussion  Additional Comments:  Pt attended morning goals group.  Riley [name] 07/01/2020, 11:38 AM

## 2020-07-01 NOTE — Progress Notes (Signed)
Recreation Therapy Notes  Date: 5.11.22 Time: 0930 Location: [address] Dayroom  Group Topic: Stress Management   Goal Area(s) Addresses:  Patient will actively participate in stress management techniques presented during session.  Patient will successfully identify benefit of practicing stress management post d/c.   Behavioral Response: Appropriate  Intervention: Guided exercise with ambient sound and script  Activity :Guided Imagery  LRT read a script that focused enjoying the sights and sounds of being in a [place] sanctuary.  Patients were to listen, focus on their breathing and relax to follow along as script was being read.  Education:  Stress Management, Discharge Planning.   Education Outcome: Acknowledges education  Clinical Observations/Feedback: Patient actively engaged in technique introduced, expressed no concerns.     Kamali Nephew, LRT/CTRS        Mackenzy Grumbine A 07/01/2020 10:47 AM 

## 2020-07-01 NOTE — Progress Notes (Signed)
   07/01/20 0900  Psych Admission Type (Psych Patients Only)  Admission Status Voluntary  Psychosocial Assessment  Patient Complaints None  Eye Contact Fair  Facial Expression Flat;Pensive;Sad  Affect Flat  Speech Logical/coherent;Soft;Slow  Interaction Assertive  Motor Activity Pacing  Appearance/Hygiene Unremarkable  Behavior Characteristics Cooperative;Appropriate to situation  Mood Pleasant;Anxious  Thought Process  Coherency WDL  Content WDL  Delusions None reported or observed  Perception Hallucinations  Hallucination Auditory  Judgment Impaired  Confusion None  Danger to Self  Current suicidal ideation? Denies  Danger to Others  Danger to Others None reported or observed

## 2020-07-01 NOTE — Progress Notes (Signed)
[facility] MD Progress Note  07/01/2020 5:13 PM [name] [name]  MRN:  [id number]   Reason for Admission:  Patient is a 25 year old male with a history of recurrent major depressive disorder with psychotic features as well as a history of past marijuana use and recent alcohol use who presented voluntarily to the behavioral health hospital as a walk-in reporting auditory hallucinations on 06/26/2020.  Objective: Medical record reviewed.  Patient's case discussed in detail with members of the treatment team.  I met with and evaluated the patient today for follow-up.  Patient states he is doing better.  He is looking forward to being discharged soon and seeing his dog and spending time outside.  He describes his mood as okay.  He denies depressed mood or anhedonia.  Patient denies auditory hallucinations and states he has not heard any in the last 2 days.  He reports feeling less paranoid "I do not think my family was trying to kill me.  I think it is just in my head."  Patient states he spoke with his grandmother on the phone and the conversation went well he feels reassured that she cares about him and that his brother also cares about him.  He is sleeping well.  His appetite is good.  Patient denies passive wish for death or suicidal ideation.  He denies AII, HI, VH.  He denies access to firearms.  Patient denies any medication side effects or physical problems.  On interview patient appears brighter at times although still generally flat and he has more spontaneous speech and better eye contact.  The patient slept 6.75 hours last night.  There are no new labs.  Signs this morning include BP of 115/75 sitting and 126/95 standing, pulse 57 sitting and 79 standing, O2 sat of 99%.  Patient is taking standing dose medications as prescribed.  Patient has been attending groups and participating appropriately.   Principal Problem: Major depressive disorder, recurrent, severe with psychotic features ([place]) Diagnosis: Principal  Problem:   Major depressive disorder, recurrent, severe with psychotic features ([place]) Active Problems:   MDD (major depressive disorder)   Psychosis ([place])  Total Time spent with patient: 20 minutes  Past Psychiatric History: See admission H&P  Past Medical History: History reviewed. No pertinent past medical history. History reviewed. No pertinent surgical history. Family History: History reviewed. No pertinent family history. Family Psychiatric  History: See admission H&P Social History:  Social History   Substance and Sexual Activity  Alcohol Use Yes   Comment: Occasional 1-2 beers     Social History   Substance and Sexual Activity  Drug Use Yes  . Types: Marijuana   Comment: occasional THC Use    Social History   Socioeconomic History  . Marital status: Single    Spouse name: Not on file  . Number of children: Not on file  . Years of education: Not on file  . Highest education level: Not on file  Occupational History  . Not on file  Tobacco Use  . Smoking status: Current Every Day Smoker    Packs/day: 0.50    Types: Cigarettes  . Smokeless tobacco: Never Used  Substance and Sexual Activity  . Alcohol use: Yes    Comment: Occasional 1-2 beers  . Drug use: Yes    Types: Marijuana    Comment: occasional THC Use  . Sexual activity: Not on file  Other Topics Concern  . Not on file  Social History Narrative  . Not on file  Social Determinants of Health   Financial Resource Strain: Not on file  Food Insecurity: Not on file  Transportation Needs: Not on file  Physical Activity: Not on file  Stress: Not on file  Social Connections: Not on file   Additional Social History:    Pain Medications: see MAR Prescriptions: see MAR Over the Counter: see MAR History of alcohol / drug use?: Yes Longest period of sobriety (when/how long): states that he has been slowing down on his use of alcohol and marijuana over the past three years Name of Substance 1:  Alcohol 1 - Age of First Use: unknown 1 - Amount (size/oz): 6 pack -12 pack of beer 1 - Frequency: daily 1 - Duration: on-gong 1 - Last Use / Amount: "I stopped drinking Apirl 2022 after the voices started coming back" 1 - Method of Aquiring: local stores 1- Route of Use: oral Name of Substance 2: THC 2 - Age of First Use: unknown 2 - Amount (size/oz): varied 2 - Frequency: occasional use 2 - Duration: on-going 2 - Last Use / Amount: "O s 2 - Method of Aquiring: "I stopped drinking Apirl 2022 after the voices started coming back" 2 - Route of Substance Use: inhalation; smoking                Sleep: Good  Appetite:  Good  Current Medications: Current Facility-Administered Medications  Medication Dose Route Frequency Provider Last Rate Last Admin  . acetaminophen (TYLENOL) tablet 650 mg  650 mg Oral Q6H PRN Sharma Covert, MD      . alum & mag hydroxide-simeth (MAALOX/MYLANTA) 200-200-20 MG/5ML suspension 30 mL  30 mL Oral Q4H PRN Sharma Covert, MD      . benztropine (COGENTIN) tablet 0.5 mg  0.5 mg Oral Q6H PRN Arthor Captain, MD      . diphenhydrAMINE (BENADRYL) injection 25 mg  25 mg Intramuscular Q6H PRN Arthor Captain, MD      . magnesium hydroxide (MILK OF MAGNESIA) suspension 30 mL  30 mL Oral Daily PRN Sharma Covert, MD      . melatonin tablet 3 mg  3 mg Oral QHS Arthor Captain, MD   3 mg at 06/30/20 2147  . nicotine polacrilex (NICORETTE) gum 2 mg  2 mg Oral PRN Sharma Covert, MD   2 mg at 07/01/20 0924  . risperiDONE (RISPERDAL) tablet 0.5 mg  0.5 mg Oral Daily Sharma Covert, MD   0.5 mg at 07/01/20 0819  . risperiDONE (RISPERDAL) tablet 3 mg  3 mg Oral QHS Arthor Captain, MD   3 mg at 06/30/20 2147  . sertraline (ZOLOFT) tablet 75 mg  75 mg Oral Daily Sharma Covert, MD   75 mg at 07/01/20 5035    Lab Results:  No results found for this or any previous visit (from the past 48 hour(s)).  Blood Alcohol level:  Lab Results   Component Value Date   ETH <10 46/56/8127    Metabolic Disorder Labs: Lab Results  Component Value Date   HGBA1C 5.1 06/28/2020   MPG 99.67 06/28/2020   MPG 96.8 06/26/2020   Lab Results  Component Value Date   PROLACTIN 68.0 (H) 07/25/2019   Lab Results  Component Value Date   CHOL 175 06/28/2020   TRIG 380 (H) 06/28/2020   HDL 36 (L) 06/28/2020   CHOLHDL 4.9 06/28/2020   VLDL 76 (H) 06/28/2020   LDLCALC 63 06/28/2020   LDLCALC 123 (H)  06/26/2020    Physical Findings: AIMS: Facial and Oral Movements Muscles of Facial Expression: None, normal Lips and Perioral Area: None, normal Jaw: None, normal Tongue: None, normal,Extremity Movements Upper (arms, wrists, hands, fingers): None, normal Lower (legs, knees, ankles, toes): None, normal, Trunk Movements Neck, shoulders, hips: None, normal, Overall Severity Severity of abnormal movements (highest score from questions above): None, normal Incapacitation due to abnormal movements: None, normal Patient's awareness of abnormal movements (rate only patient's report): No Awareness, Dental Status Current problems with teeth and/or dentures?: No Does patient usually wear dentures?: No  CIWA:    COWS:     Musculoskeletal: Strength & Muscle Tone: within normal limits Gait & Station: normal Patient leans: N/A  Psychiatric Specialty Exam:  Presentation  General Appearance: Appropriate for Environment; Casual  Eye Contact:Fair  Speech:Clear and Coherent; Normal Rate  Speech Volume:Normal  Handedness:Right   Mood and Affect  Mood:Euthymic  Affect:Flat   Thought Process  Thought Processes:Coherent; Goal Directed  Descriptions of Associations:Intact  Orientation:Full (Time, Place and Person)  Thought Content:Paranoid Ideation (Mild paranoid ideation significantly less than on admission)  History of Schizophrenia/Schizoaffective disorder:Yes  Duration of Psychotic Symptoms:Greater than six  months  Hallucinations:Hallucinations: None  Ideas of Reference:Paranoia (Mild paranoid ideation.  Significantly less than on admission)  Suicidal Thoughts:Suicidal Thoughts: No  Homicidal Thoughts:Homicidal Thoughts: No   Sensorium  Memory:Immediate Good; Remote Good; Recent Good  Judgment:Fair  Insight:Fair   Executive Functions  Concentration:Good  Attention Span:Good  Recall:Good  Fund of Knowledge:Fair  Language:Good   Psychomotor Activity  Psychomotor Activity:Psychomotor Activity: Normal   Assets  Assets:Communication Skills; Desire for Improvement; Housing; Physical Health; Resilience; Social Support   Sleep  Sleep:Sleep: Good Number of Hours of Sleep: 6.75    Physical Exam: Physical Exam Vitals and nursing note reviewed.  HENT:     Head: Normocephalic and atraumatic.  Pulmonary:     Effort: Pulmonary effort is normal.  Neurological:     General: No focal deficit present.     Mental Status: He is alert and oriented to person, place, and time.    Review of Systems  Constitutional: Negative for diaphoresis and fever.  HENT: Negative for hearing loss and sore throat.   Respiratory: Negative for cough and shortness of breath.   Cardiovascular: Negative for chest pain and palpitations.  Gastrointestinal: Negative for constipation, diarrhea, nausea and vomiting.  Musculoskeletal: Negative.   Neurological: Negative for dizziness, tremors, speech change, seizures and headaches.  Psychiatric/Behavioral: Negative for hallucinations and suicidal ideas. The patient is not nervous/anxious and does not have insomnia.    Blood pressure (!) 126/95, pulse 79, temperature 97.7 F (36.5 C), temperature source Oral, resp. rate 18, height _0  (1.854 m), weight 88 kg, SpO2 99 %. Body mass index is 25.6 kg/m.   Treatment Plan Summary: Daily contact with patient to assess and evaluate symptoms and progress in treatment and Medication management    Diagnosis: 1. Major depression, recurrent, severe with psychotic features 2. Sinus bradycardia  Pertinent findings on examination today: 1.  Auditory hallucinations have resolved 2.  Paranoia has diminished 3.  Mood has improved and affect remains flat 4.  Anxiety has diminished 5.  The patient is sleeping well, reports feeling less tired and appears less tired on exam 6.  Patient continues to report no suicidal ideation or passive wish for death  Plan:  1.  Continue Risperdal 0.5 mg p.o. daily and 3 mg p.o. nightly for psychosis and mood stability. 2.  Continue sertraline 75  mg p.o. daily for anxiety and depression 3.  Continue Cogentin 0.5 mg p.o. every 6 hours as needed tremor. 4.  Continue melatonin 3 mg at bedtime for sleep 6.  Disposition planning-in progress.  Social work is trying to clarify where patient will live after discharge so that aftercare appointments can be arranged.  Anticipate probable discharge on 07/02/2020.   Arthor Captain, MD 07/01/2020, 5:13 PM

## 2020-07-01 NOTE — BHH Group Notes (Signed)
Pt did not attend wrap-up group   

## 2020-07-02 DIAGNOSIS — F333 Major depressive disorder, recurrent, severe with psychotic symptoms: Principal | ICD-10-CM

## 2020-07-02 MED ORDER — BENZTROPINE MESYLATE 0.5 MG PO TABS: 0.5000 mg | ORAL_TABLET | Freq: Four times a day (QID) | ORAL | 0 refills | Status: AC | PRN

## 2020-07-02 MED ORDER — SERTRALINE HCL 25 MG PO TABS
75.0000 mg | ORAL_TABLET | Freq: Every day | ORAL | Status: DC
  Filled 2020-07-02 (×2): qty 21

## 2020-07-02 MED ORDER — RISPERIDONE 3 MG PO TABS: 3.0000 mg | ORAL_TABLET | Freq: Every day | ORAL | 0 refills | Status: AC

## 2020-07-02 MED ORDER — RISPERIDONE 0.5 MG PO TABS: 0.5000 mg | ORAL_TABLET | Freq: Every day | ORAL | 0 refills | Status: AC

## 2020-07-02 MED ORDER — MELATONIN 3 MG PO TABS: 3.0000 mg | ORAL_TABLET | Freq: Every day | ORAL | 0 refills | Status: AC

## 2020-07-02 MED ORDER — NICOTINE POLACRILEX 2 MG MT GUM: 2.0000 mg | CHEWING_GUM | OROMUCOSAL | 0 refills | Status: AC | PRN

## 2020-07-02 MED ORDER — SERTRALINE HCL 25 MG PO TABS: 75.0000 mg | ORAL_TABLET | Freq: Every day | ORAL | 0 refills | Status: AC

## 2020-07-02 NOTE — Progress Notes (Addendum)
D: Pt A & O X 4. Denies SI, HI, AVH and pain at this time. Affect is flat but pt brightens up on interactions. D/C home as ordered. Picked up in lobby by his brother.  A: D/C instructions reviewed with pt including electronic prescriptions, medication samples and follow up appointments; compliance encouraged. All belongings from locker # given to pt at time of departure. Scheduled medications given with verbal education and effects monitored. Safety checks maintained without incident till time of d/c.  R: Pt receptive to care. Compliant with medications when offered. Denies adverse drug reactions when assessed. Verbalized understanding related to d/c instructions. Signed belonging sheet in agreement with items received from locker and safe ($ 706). Pt ambulatory in milieu with a steady gait. Appears to be in no physical distress at time of departure.

## 2020-07-02 NOTE — Discharge Summary (Signed)
Physician Discharge Summary Note  Patient:  Riley [name] is an 25 y.o., male MRN:  [id number] DOB:  13-Dec-1995 Patient phone:  [phone] (home)  Patient address:   [place] [id number],   Total Time spent with patient: Greater than 30 minutes  Date of Admission:  06/25/2020 Date of Discharge: 07/02/20  Reason for Admission: "I am hearing voices."  Principal Problem: Major depressive disorder, recurrent, severe with psychotic features [facility])   Discharge Diagnoses: Principal Problem:   Major depressive disorder, recurrent, severe with psychotic features ([place]) Active Problems:   MDD (major depressive disorder)   Psychosis ([place])  Past Psychiatric History: History of daily marijuana use and depression. No prior hospitalizations.  Past Medical History: History reviewed. No pertinent past medical history. History reviewed. No pertinent surgical history.  Family History: History reviewed. No pertinent family history.  Family Psychiatric  History: Father with substance and mental health problems who died by suicide.  Social History:  Social History   Substance and Sexual Activity  Alcohol Use Yes   Comment: Occasional 1-2 beers     Social History   Substance and Sexual Activity  Drug Use Yes  . Types: Marijuana   Comment: occasional THC Use    Social History   Socioeconomic History  . Marital status: Single    Spouse name: Not on file  . Number of children: Not on file  . Years of education: Not on file  . Highest education level: Not on file  Occupational History  . Not on file  Tobacco Use  . Smoking status: Current Every Day Smoker    Packs/day: 0.50    Types: Cigarettes  . Smokeless tobacco: Never Used  Substance and Sexual Activity  . Alcohol use: Yes    Comment: Occasional 1-2 beers  . Drug use: Yes    Types: Marijuana    Comment: occasional THC Use  . Sexual activity: Not on file  Other Topics Concern  . Not on file  Social History  Narrative  . Not on file   Social Determinants of Health   Financial Resource Strain: Not on file  Food Insecurity: Not on file  Transportation Needs: Not on file  Physical Activity: Not on file  Stress: Not on file  Social Connections: Not on file   Hospital Course: (Per Md's admission evaluation notes): Patient's case discussed in detail with members of the treatment team. I met with and evaluated the patient in the office on the unit today. Riley [name] a 25 year old male with a history of recurrent major depressive disorder with psychotic features as well as a history of past marijuana use and recent alcohol use who [facility] reporting a chief complaint of "I am hearing voices." The patient was previously admitted to [facility] similar symptoms in June 2021 and was diagnosed with major depressive disorder with psychotic features and discharged on a combination of risperidone and sertraline. Patient states that he did well on these medications and then his mother died in Feb 27, 2020 and he started to become more depressed. The patient stopped taking his medications in February 2022 and sometime in April he started to hear voices again. The patient resumed taking risperidone 2 mg at bedtime at the end of April but states that thus far it has not helped reduce the voices. Patient reports that he has missed some risperidone doses since he restarted his medications. Patient experiences auditory hallucinations of voices of family members and others making  statements that they want to kill the patient and that he cannot hide. They also tell patient to kill himself. States that the voices talk about patient and comment on what he does. He at times also feels like he can read the thoughts of others. Patient has paranoia that people are after him and trying to kill him. This paranoia has resulted in difficulty sleeping and he has at times spent the night  driving in his car due to feeling anxious,paranoid and unsafe. Reports symptoms of depressed mood, decreased appetite, anxiety, anhedonia, low energy, decreased concentration, guilt, sleep disturbance (mostly insomnia) and feeling slowed down. He denies that he wants to die or wants to her kill himself.He denies feeling compelled to act on statements made by the voices he hears. The patient denies VH, AI, HI. He denies any recent drug use. He had been drinking alcohol following the death of his mother in April 04, 2019 and this increased to between 6 and 12 beers a day. When he resumed taking his risperidone about 10 days ago he significantly decreased his alcohol use and has been drinking 1-2 beers approximately every other day. The patient denies any symptoms of alcohol withdrawal. He denies other drug use since he was discharged from [place] 04-Apr-2019.  Prior to this discharge, [name] was seen & evaluated for mental health stability. The current laboratory findings were reviewed (stable), nurses notes & vital signs were reviewed as well. There are no current mental health or medical issues that should prevent this discharge at this time. Patient is being discharged to continue mental health care as noted below.   This istheseconddischarge summary for this65year old male from this [place]. He was a patient in this hospital from June 3rd thru June 7th of 2021 with similar presentation.Riley [name] hasprevious hx of mental illness.He reported onthis presentadmission worsening auditory hallucinations requesting treatment. His UDS was clear of all illegal substances.  After evaluation of hispresenting symptoms, it was jointly agreed by the treatment team torecommend Masonfor mood stabilization treatments. Thethe medication regimentargeting those presenting symptomswere discussed &initiatedwith hisconsent. Hewas treated, stabilized & discharged on themedicationsas listed below on his discharge  medication lists. He was also enrolled & participated in the group counseling sessions being offered & held on this unit.He learned coping skills.He presented no other significant medical issues that required treatment  & or monitoring.He tolerated histreatment regimen without any adverse effects or reactions reported.   Myan's symptoms responded well to histreatment regimen warranting this discharge. This is evidenced by hisreports of improved mood, resolution of symptoms &presentation of good affect/eye contact.Heiscurrently mentally & medically stable for dischargeto continue mental health careas recommended below.   Today upon discharge evaluationwith the attending psychiatrist today,Masonshares, "I'm doing good. I feel much better".He denies any specific concerns.He is sleeping well. Hisappetite is good. He denies other physical complaints.He denies SI/HI/AH/VH, delusional thoughts or paranoia.He is tolerating hismedications well &in agreement to continue hiscurrent regimen as noted on the discharge medication lists below.He will follow up for routine psychiatric care&medication management as noted below. He is provided with all the necessary information needed to make these appointments without problems. He was able to engage in safety planning including plan to return to [facility] or contact emergency services if he feels unable to maintain hisown safety or the safety of others. Pt had no further questions, comments or concerns. He left [facility] with all personal belongings in no apparent distress.Transportation per brother.  Physical Findings: AIMS: Facial and Oral Movements Muscles of Facial Expression: None,  normal Lips and Perioral Area: None, normal Jaw: None, normal Tongue: None, normal,Extremity Movements Upper (arms, wrists, hands, fingers): None, normal Lower (legs, knees, ankles, toes): None, normal, Trunk Movements Neck, shoulders, hips: None, normal, Overall  Severity Severity of abnormal movements (highest score from questions above): None, normal Incapacitation due to abnormal movements: None, normal Patient's awareness of abnormal movements (rate only patient's report): No Awareness, Dental Status Current problems with teeth and/or dentures?: No Does patient usually wear dentures?: No  CIWA:    COWS:     Musculoskeletal: Strength & Muscle Tone: within normal limits Gait & Station: normal Patient leans: N/A  Psychiatric Specialty Exam: Physical Exam Vitals and nursing note reviewed.  Constitutional:      Appearance: He is well-developed.  HENT:     Head: Normocephalic.     Nose: Nose normal.     Mouth/Throat:     Pharynx: Oropharynx is clear.  Eyes:     Pupils: Pupils are equal, round, and reactive to light.  Cardiovascular:     Rate and Rhythm: Normal rate.     Pulses: Normal pulses.  Pulmonary:     Effort: Pulmonary effort is normal.  Genitourinary:    Comments: Deferred Musculoskeletal:        General: Normal range of motion.     Cervical back: Normal range of motion.  Skin:    General: Skin is warm and dry.  Neurological:     General: No focal deficit present.     Mental Status: He is alert and oriented to person, place, and time. Mental status is at baseline.     Review of Systems  Constitutional: Negative.   HENT: Negative.   Eyes: Negative.   Respiratory: Negative.  Negative for cough and shortness of breath.   Cardiovascular: Negative.   Gastrointestinal: Negative.   Endocrine: Negative.   Genitourinary: Negative.   Allergic/Immunologic:       Allergies: NKDA  Neurological: Negative.   Psychiatric/Behavioral: Positive for dysphoric mood (Hx. od (stable on medication)), hallucinations (Hx. psychosis (stable on medication)) and sleep disturbance (Hx of (stable on medication)). Negative for agitation, behavioral problems, confusion, decreased concentration, self-injury and suicidal ideas. The patient is not  nervous/anxious (Stable upon discharge) and is not hyperactive.     Blood pressure 126/69, pulse 72, temperature (!) 97.5 F (36.4 C), temperature source Oral, resp. rate 18, height $RemoveBe'6\' 1"'SPSjYhXFz$  (1.854 m), weight 88 kg, SpO2 99 %.Body mass index is 25.6 kg/m.  See MD's discharge SRA   Have you used any form of tobacco in the last 30 days? (Cigarettes, Smokeless Tobacco, Cigars, and/or Pipes): Yes  Has this patient used any form of tobacco in the last 30 days? (Cigarettes, Smokeless Tobacco, Cigars, and/or Pipes): Yes, an FDA-approved tobacco cessation medication was offered at discharge.  Blood Alcohol level:  Lab Results  Component Value Date   ETH <10 56/25/6389   Metabolic Disorder Labs:  Lab Results  Component Value Date   HGBA1C 5.1 06/28/2020   MPG 99.67 06/28/2020   MPG 96.8 06/26/2020   Lab Results  Component Value Date   PROLACTIN 68.0 (H) 07/25/2019   Lab Results  Component Value Date   CHOL 175 06/28/2020   TRIG 380 (H) 06/28/2020   HDL 36 (L) 06/28/2020   CHOLHDL 4.9 06/28/2020   VLDL 76 (H) 06/28/2020   LDLCALC 63 06/28/2020   LDLCALC 123 (H) 06/26/2020   See Psychiatric Specialty Exam and Suicide Risk Assessment completed by Attending Physician prior to discharge.  Discharge destination:  Home  Is patient on multiple antipsychotic therapies at discharge:  No   Has Patient had three or more failed trials of antipsychotic monotherapy by history:  No  Recommended Plan for Multiple Antipsychotic Therapies: NA  Allergies as of 07/02/2020   No Known Allergies     Medication List    STOP taking these medications   risperiDONE 2 MG disintegrating tablet Commonly known as: RISPERDAL M-TABS Replaced by: risperiDONE 3 MG tablet     TAKE these medications     Indication  benztropine 0.5 MG tablet Commonly known as: COGENTIN Take 1 tablet (0.5 mg total) by mouth every 6 (six) hours as needed for tremors (stiffness or extrapyramidal side effects).  Indication:  Extrapyramidal Reaction caused by Medications   melatonin 3 MG Tabs tablet Take 1 tablet (3 mg total) by mouth at bedtime. For sleep  Indication: Trouble Sleeping   nicotine polacrilex 2 MG gum Commonly known as: NICORETTE Take 1 each (2 mg total) by mouth as needed. (May buy from over the counter): For smoking cessation  Indication: Nicotine Addiction   risperiDONE 3 MG tablet Commonly known as: RISPERDAL Take 1 tablet (3 mg total) by mouth at bedtime. For mood control Replaces: risperiDONE 2 MG disintegrating tablet  Indication: Mood control   risperiDONE 0.5 MG tablet Commonly known as: RISPERDAL Take 1 tablet (0.5 mg total) by mouth daily. For mood control Start taking on: Jul 03, 2020  Indication: Mood control   sertraline 25 MG tablet Commonly known as: ZOLOFT Take 3 tablets (75 mg total) by mouth daily. For depression Start taking on: Jul 03, 2020  Indication: Major Depressive Disorder       Follow-up Information    [facility]. Go on 07/15/2020.   Why: You have an appointment for therapy services on 07/15/20 at 2:00 pm (in person).  You also have an appointment for medication management services on 07/24/20 at 1:30 pm. (Virtual tele-health) Contact information: [place] [address]  P:  [phone]       [facility]. Go on 07/03/2020.   Specialty: Behavioral Health Why: You have an appointment for therapy services on 07/03/20 at 12:00 pm.  This provider also has medication management services available.  This appointment is held in person and is first come, first served.   Contact information: [place] [phone]             Follow-up recommendations: Activity as tolerated. Diet as recommended by primary care physician. Keep all scheduled follow-up appointments as recommended.   Comments:  Prescriptions given at discharge.  Patient  agreeable to plan.  Given opportunity to ask questions.  Appears to feel comfortable with discharge denies any current suicidal or homicidal thought. Patient is also instructed prior to discharge to: Take all medications as prescribed by his/her mental healthcare provider. Report any adverse effects and or reactions from the medicines to his/her outpatient provider promptly. Patient has been instructed & cautioned: To not engage in alcohol and or illegal drug use while on prescription medicines. In the event of worsening symptoms, patient is instructed to call the crisis hotline, 911 and or go to the nearest ED for appropriate evaluation and treatment of symptoms. To follow-up with his/her primary care provider for your other medical issues, concerns and or health care needs.  Signed: Lindell Spar, NP 07/02/2020, 10:38 AM

## 2020-07-02 NOTE — Progress Notes (Signed)
   07/02/20 0500  Sleep  Number of Hours 6.25

## 2020-07-02 NOTE — BHH Suicide Risk Assessment (Signed)
[facility] Discharge Suicide Risk Assessment   Principal Problem: Major depressive disorder, recurrent, severe with psychotic features [facility]) Discharge Diagnoses: Principal Problem:   Major depressive disorder, recurrent, severe with psychotic features ([facility]) Active Problems:   MDD (major depressive disorder)   Psychosis ([facility])   Total Time spent with patient: 15 minutes  Musculoskeletal: Strength & Muscle Tone: within normal limits Gait & Station: normal Patient leans: N/A  Psychiatric Specialty Exam  Presentation  General Appearance: Appropriate for Environment; Casual  Eye Contact:Fair  Speech:Clear and Coherent; Normal Rate  Speech Volume:Normal  Handedness:Right   Mood and Affect  Mood:Euthymic  Duration of Depression Symptoms: Greater than two weeks  Affect:Flat   Thought Process  Thought Processes:Coherent; Goal Directed; Linear  Descriptions of Associations:Intact  Orientation:Full (Time, Place and Person)  Thought Content:Logical (No paranoid ideation elicited.)  History of Schizophrenia/Schizoaffective disorder:Yes  Duration of Psychotic Symptoms:Greater than six months  Hallucinations:Hallucinations: None  Ideas of Reference:None  Suicidal Thoughts:Suicidal Thoughts: No  Homicidal Thoughts:Homicidal Thoughts: No   Sensorium  Memory:Immediate Good; Recent Good; Remote Good  Judgment:Fair  Insight:Fair   Executive Functions  Concentration:Good  Attention Span:Good  Recall:Good  Fund of Knowledge:Good  Language:Good   Psychomotor Activity  Psychomotor Activity:Psychomotor Activity: Normal   Assets  Assets:Communication Skills; Desire for Improvement; Housing; Physical Health; Resilience; Social Support   Sleep  Sleep:Sleep: Fair Number of Hours of Sleep: 6.25   Physical Exam: Physical Exam Vitals and nursing note reviewed.  HENT:     Head: Normocephalic and atraumatic.  Pulmonary:     Effort: Pulmonary effort is  normal.  Neurological:     General: No focal deficit present.     Mental Status: He is oriented to person, place, and time.    Review of Systems  Respiratory: Negative.   Cardiovascular: Negative.   Gastrointestinal: Negative.   Musculoskeletal: Negative.   Neurological: Negative.   Psychiatric/Behavioral: Negative for depression, hallucinations and suicidal ideas.   Blood pressure 126/69, pulse 72, temperature (!) 97.5 F (36.4 C), temperature source Oral, resp. rate 18, height 6\' 1"  (1.854 m), weight 88 kg, SpO2 99 %. Body mass index is 25.6 kg/m.  Mental Status Per Nursing Assessment::   On Admission:  NA  Demographic Factors:  Male and Caucasian  Loss Factors: Loss of significant relationship  Historical Factors: Family history of suicide and Family history of mental illness or substance abuse  Risk Reduction Factors:   Sense of responsibility to family, Living with another person, especially a relative, future oriented outlook, responsibility to beloved pet and positive social support  Continued Clinical Symptoms:  Depression - improved Recent psychotic symptoms - improved Previous Psychiatric Diagnoses and Treatments  Cognitive Features That Contribute To Risk:  None    Suicide Risk:  Minimal: No identifiable suicidal ideation.  Patients presenting with no risk factors but with morbid ruminations; may be classified as minimal risk based on the severity of the depressive symptoms   Follow-up Information    [facility]. Go on 07/15/2020.   Why: You have an appointment for therapy services on 07/15/20 at 2:00 pm (in person).  You also have an appointment for medication management services on 07/24/20 at 1:30 pm. (Virtual tele-health) Contact information: [address] [place], [place]  P:  [phone]       [facility]. Go on 07/03/2020.   Specialty: Behavioral Health Why: You have an  appointment for therapy services on 07/03/20 at 12:00 pm.  This provider also has medication management services available.  This appointment is held in person and is first come, first served.   Contact information: [address] [place] [address] [phone]              Plan Of Care/Follow-up recommendations:  Activity:  As tolerated  Tests: - You will need to have blood drawn periodically for lab tests to monitor your cholesterol and blood glucose levels while you are taking risperidone.  Your outpatient psychiatrist will advise you of when lab work needs to be performed.  Other:   - Take medications as prescribed.   - Do not drink alcohol.  Do not use marijuana or other drugs.   - Keep outpatient mental health follow-up appointments with psychiatrist and therapist.   - See your primary care provider regarding follow-up of any medical issues.  Claudie Revering, MD 07/02/2020, 8:04 AM

## 2020-07-02 NOTE — Progress Notes (Addendum)
  [facility] Adult Case Management Discharge Plan :  Will you be returning to the same living situation after discharge:  No. with brother At discharge, do you have transportation home?: Yes,  via brother Do you have the ability to pay for your medications: Yes,  has income  Release of information consent forms completed and in the chart;  Patient's signature needed at discharge.  Patient to Follow up at:  Follow-up Information    [facility]. Go on 07/15/2020.   Why: You have an appointment for therapy services on 07/15/20 at 2:00 pm (in person).  You also have an appointment for medication management services on 07/24/20 at 1:30 pm. (Virtual tele-health) Contact information: [address] [place], [place] [address]  P:  [phone]       [facility]. Go on 07/03/2020.   Specialty: Behavioral Health Why: You have an appointment for therapy services on 07/03/20 at 12:00 pm.  This provider also has medication management services available.  This appointment is held in person and is first come, first served. You can also request substance use services here.  Contact information: [address] [place] [address] [phone]              Next level of care provider has access to [facility] Link:yes  Safety Planning and Suicide Prevention discussed: Yes,  with grandma  Have you used any form of tobacco in the last 30 days? (Cigarettes, Smokeless Tobacco, Cigars, and/or Pipes): Yes  Has patient been referred to the Quitline?: Patient refused referral  Patient has been referred for addiction treatment: N/A  Felizardo Hoffmann, LCSWA 07/02/2020, 10:58 AM

## 2020-07-03 ENCOUNTER — Ambulatory Visit (INDEPENDENT_AMBULATORY_CARE_PROVIDER_SITE_OTHER): Payer: Self-pay | Admitting: Behavioral Health

## 2020-07-03 ENCOUNTER — Other Ambulatory Visit: Payer: Self-pay

## 2020-07-03 DIAGNOSIS — F333 Major depressive disorder, recurrent, severe with psychotic symptoms: Secondary | ICD-10-CM

## 2020-07-03 NOTE — Progress Notes (Signed)
   THERAPIST PROGRESS NOTE  Session Time: 20 mins  Participation Level: Minimal  Behavioral Response: Well GroomedAlertEuthymic  Type of Therapy: Individual Therapy  Treatment Goals addressed: Coping  Interventions: Supportive  Summary: Riley [name] is a 25 y.o. male who presents to [facility] for a hospital discharge follow-up appointment. Clt reports he plans to follow-up with his outpatient provider in [facility] [place], [place]) .... Phone: [phone]. Clt informed this Clinical research associate that he has an appointment scheduled with this provider on May 25th for medication management and outpatient therapy. He reports he got his prescribed medications filled today by the pharmacy. Clt reported to this Clinical research associate, "my voices have pretty much stopped and it feels like my medicines are working". Clt declined to receive services through [facility].  Suicidal/Homicidal: No  Therapist Response: Therapist assessed clt's current mental state by asking questions and observing clt's presentation.  Plan: Clt reports he plans to follow-up with his outpatient provider in [facility] [name], [place] .[place].. Phone: [phone]. Clt informed this Clinical research associate that he has an appointment scheduled with this provider on May 25th for medication management and outpatient therapy.   Diagnosis: Axis I: Major Depression, Recurrent severe with psychotic features    Axis II: No diagnosis    Mamie Nick, Counselor 07/03/2020
